# Patient Record
Sex: Male | Born: 1980 | Race: Black or African American | Hispanic: No | Marital: Married
Health system: Midwestern US, Community
[De-identification: ages and names within clinical notes are randomized; demographics above are authoritative.]

## PROBLEM LIST (undated history)

## (undated) DIAGNOSIS — B349 Viral infection, unspecified: Secondary | ICD-10-CM

## (undated) HISTORY — PX: COLONOSCOPY: SHX174

## (undated) HISTORY — PX: APPENDECTOMY: SHX54

---

## 2008-02-26 ENCOUNTER — Emergency Department (HOSPITAL_COMMUNITY): Admission: EM | Admit: 2008-02-26 | Discharge: 2008-02-26 | Payer: Self-pay | Admitting: Emergency Medicine

## 2008-05-25 ENCOUNTER — Emergency Department (HOSPITAL_COMMUNITY): Admission: EM | Admit: 2008-05-25 | Discharge: 2008-05-25 | Payer: Self-pay | Admitting: Emergency Medicine

## 2008-06-03 ENCOUNTER — Emergency Department (HOSPITAL_COMMUNITY): Admission: EM | Admit: 2008-06-03 | Discharge: 2008-06-03 | Payer: Self-pay | Admitting: Emergency Medicine

## 2008-06-21 ENCOUNTER — Emergency Department (HOSPITAL_COMMUNITY): Admission: EM | Admit: 2008-06-21 | Discharge: 2008-06-21 | Payer: Self-pay | Admitting: Emergency Medicine

## 2009-01-08 ENCOUNTER — Emergency Department (HOSPITAL_COMMUNITY): Admission: EM | Admit: 2009-01-08 | Discharge: 2009-01-08 | Payer: Self-pay | Admitting: Emergency Medicine

## 2009-01-15 ENCOUNTER — Emergency Department (HOSPITAL_COMMUNITY): Admission: EM | Admit: 2009-01-15 | Discharge: 2009-01-15 | Payer: Self-pay | Admitting: Emergency Medicine

## 2009-01-31 ENCOUNTER — Emergency Department (HOSPITAL_COMMUNITY): Admission: EM | Admit: 2009-01-31 | Discharge: 2009-01-31 | Payer: Self-pay | Admitting: Emergency Medicine

## 2009-04-10 IMAGING — CR DG ELBOW 2V*L*
2 series · 2 of 2 positions shown · non-contrast
Comparison: Humerus films of 06/03/2008

CLINICAL DATA: Laceration to posterior elbow/distal humerus 19 days
ago.  Status post suturing.  Nonhealing wound.  Question foreign
body.

LEFT ELBOW - 2 VIEW

[view not recorded (1 of 2)]
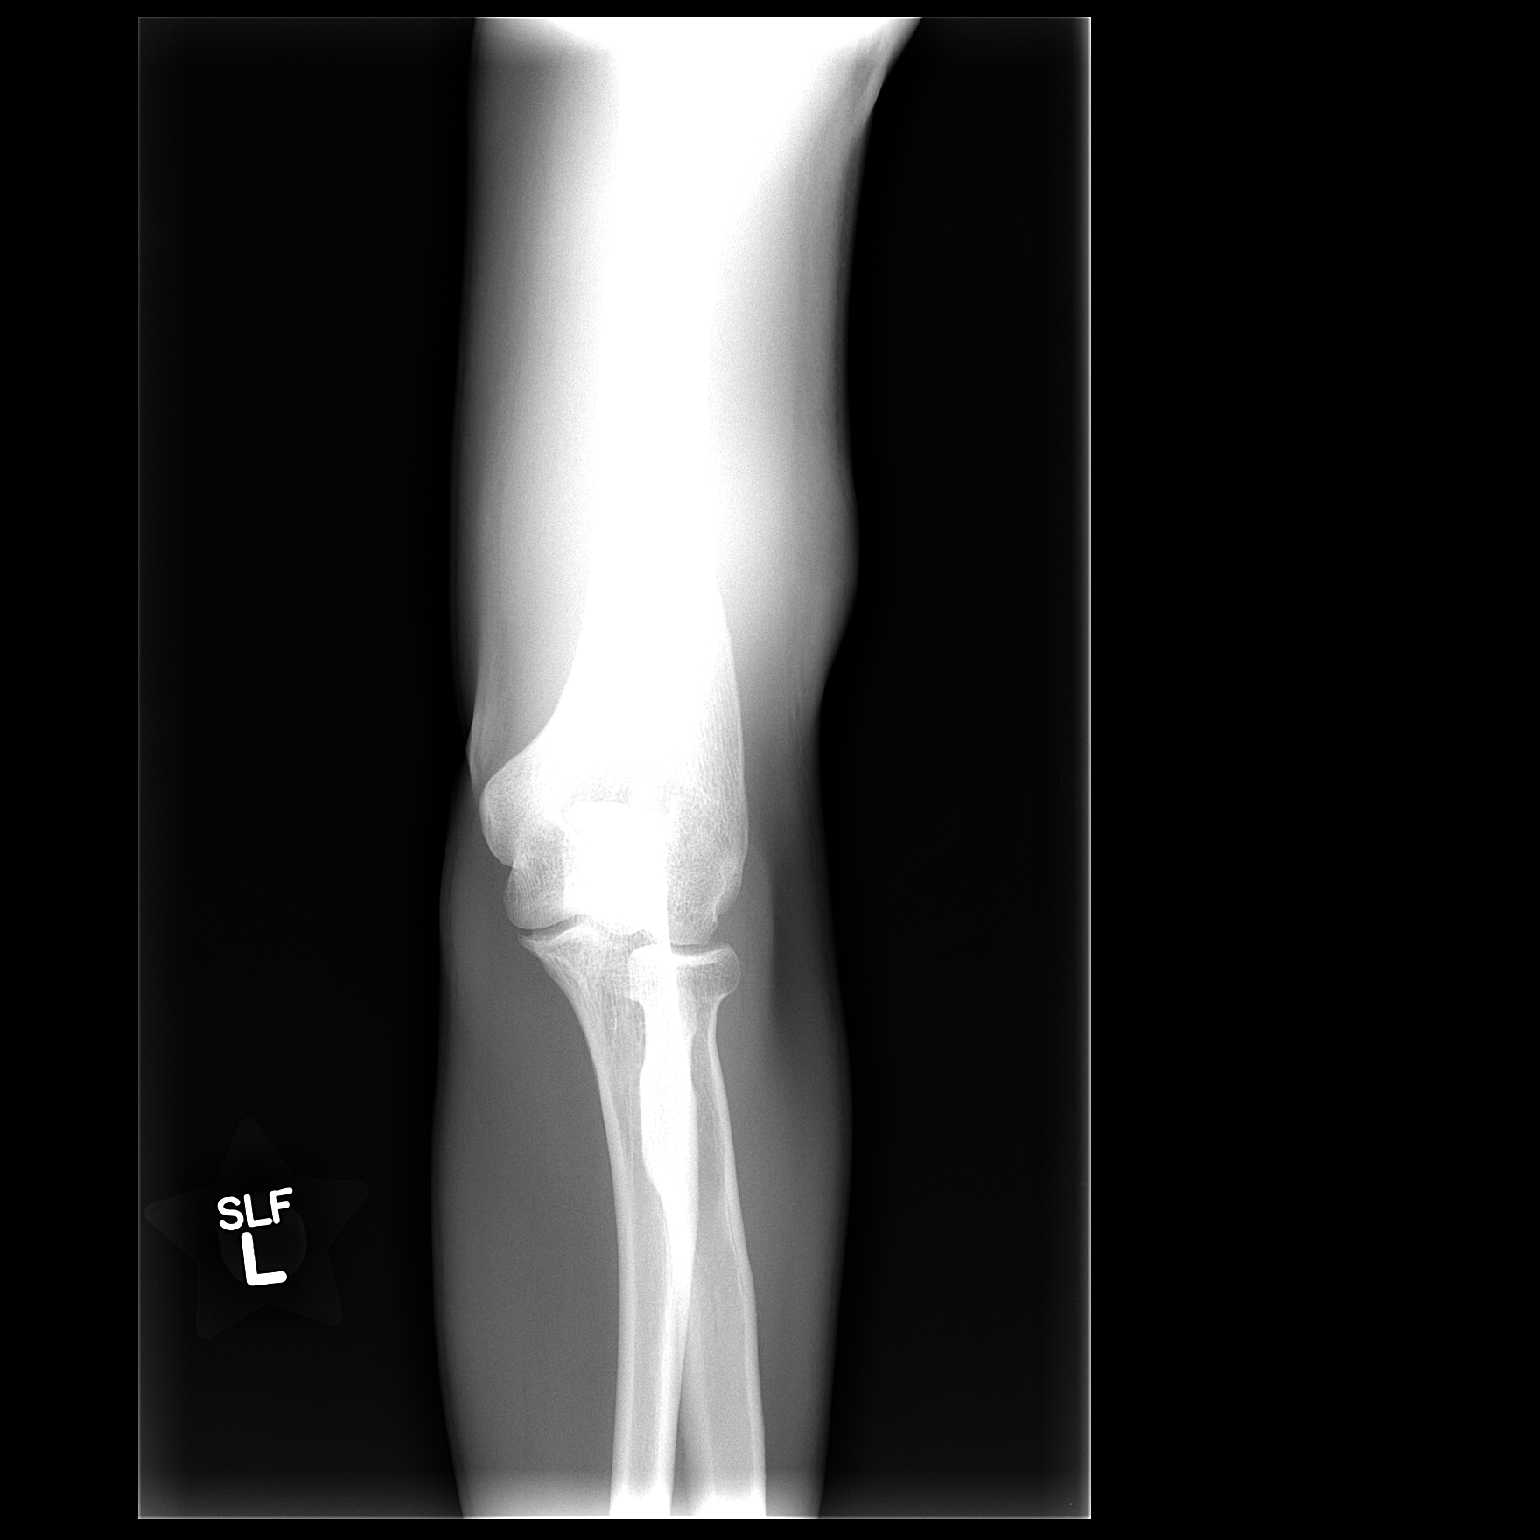

[view not recorded (2 of 2)]
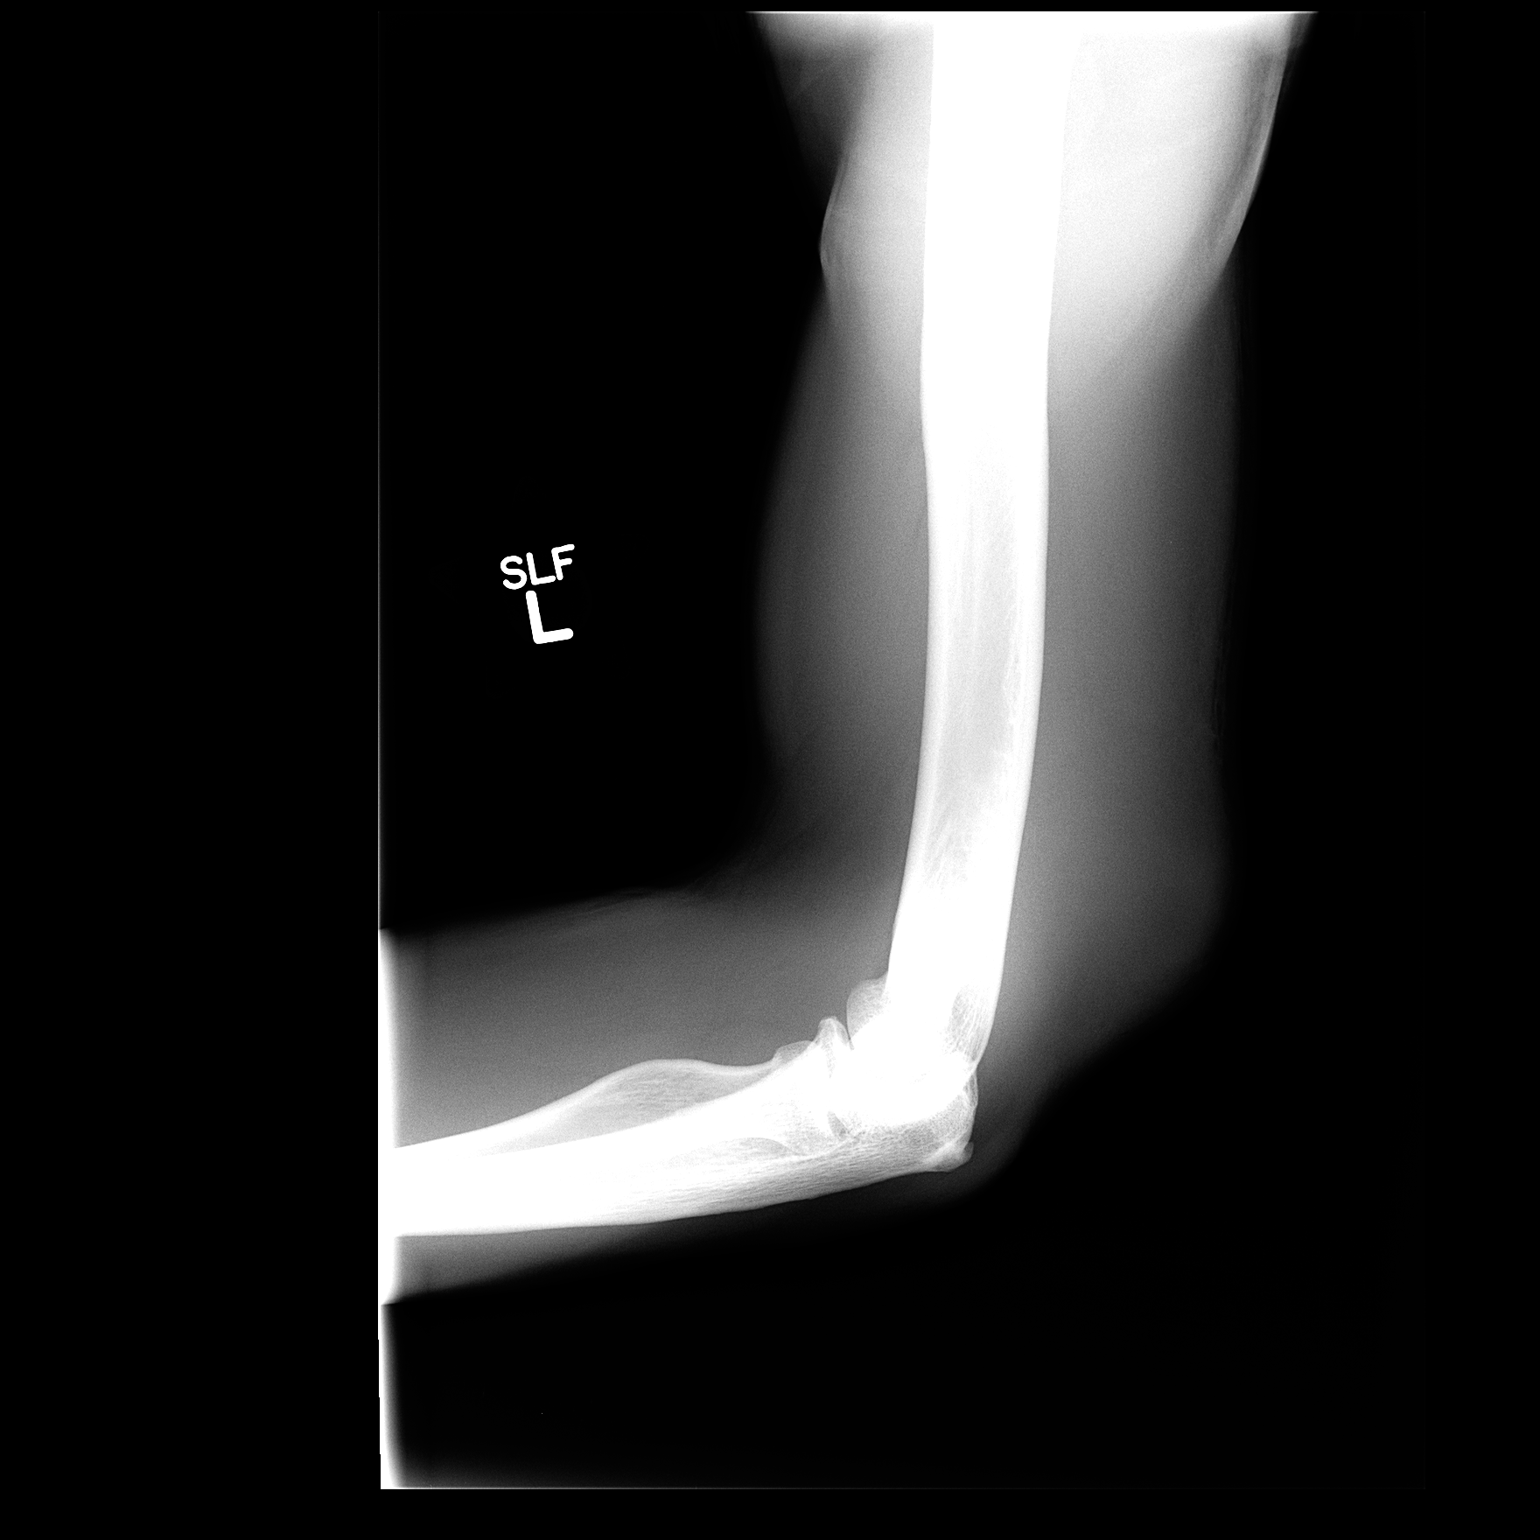

[2 of 2 positions shown; findings below may reference images not displayed]

FINDINGS: No acute fracture dislocation.  Soft tissue swelling
posterior and radial to the mid distal humeral shaft.  No evidence
of soft tissue gas or radiopaque foreign object.  No osseous
destruction.  Minimal enthesophyte at the triceps insertion.  No
elbow joint effusion.
IMPRESSION: Soft tissue swelling about the distal humerus without radiopaque
foreign object.

## 2009-08-10 ENCOUNTER — Emergency Department (HOSPITAL_COMMUNITY): Admission: EM | Admit: 2009-08-10 | Discharge: 2009-08-10 | Payer: Self-pay | Admitting: Emergency Medicine

## 2009-09-05 ENCOUNTER — Emergency Department (HOSPITAL_COMMUNITY): Admission: EM | Admit: 2009-09-05 | Discharge: 2009-09-05 | Payer: Self-pay | Admitting: Emergency Medicine

## 2010-06-25 IMAGING — CR DG HAND COMPLETE 3+V*R*
3 series · 3 of 3 positions shown · non-contrast
Comparison: None.

CLINICAL DATA: Infected finger.  Pain and swelling third finger.

RIGHT HAND - COMPLETE 3+ VIEW

[x hand pa right]
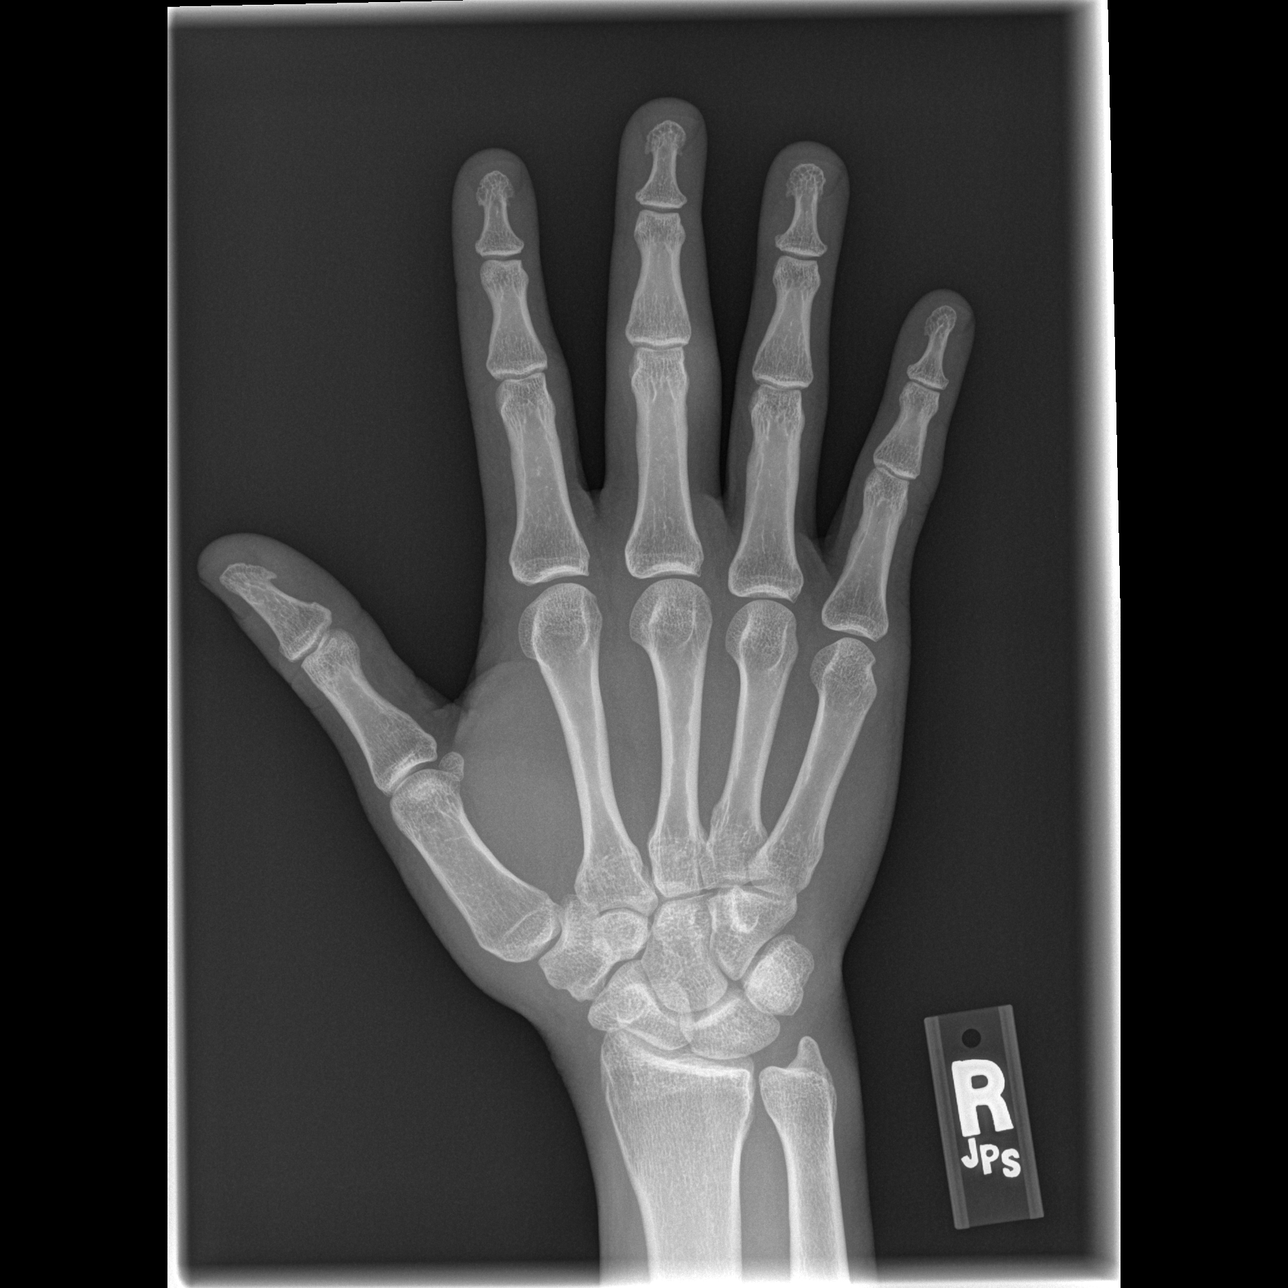

[x hand oblique right]
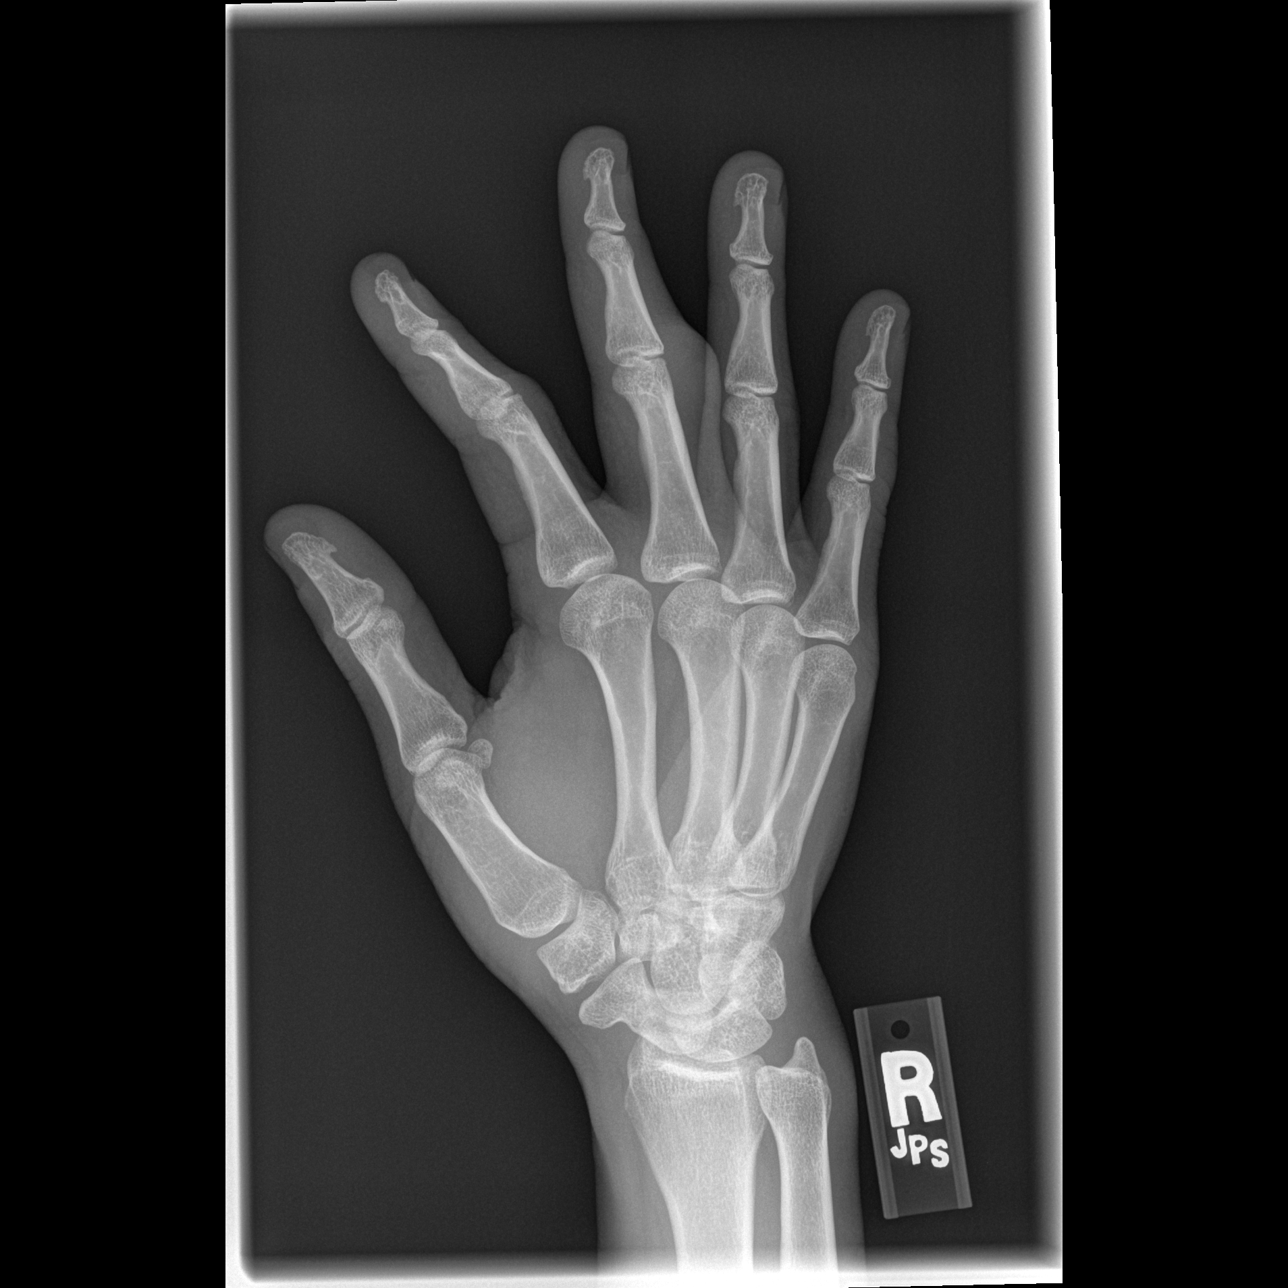

[x hand lat right]
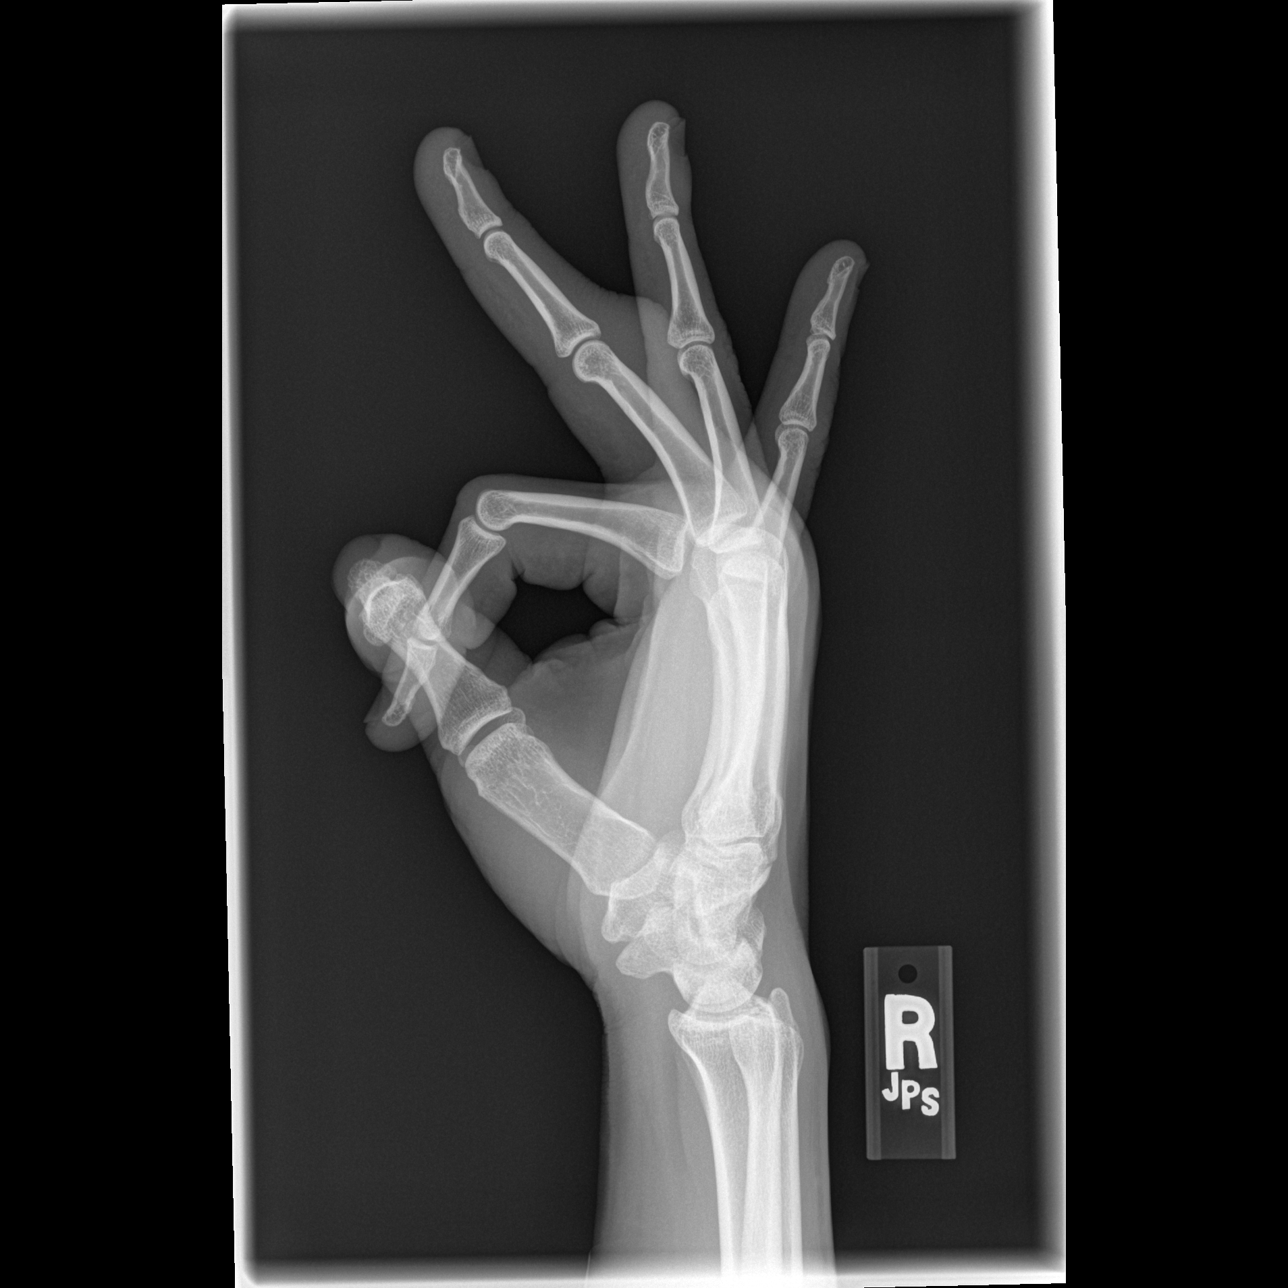

[3 of 3 positions shown; findings below may reference images not displayed]

FINDINGS: Soft tissue swelling adjacent to the PIP joint of the
third finger.  No radiopaque foreign body or acute bony
abnormality.
IMPRESSION: No acute osseous findings.

## 2010-07-23 LAB — BASIC METABOLIC PANEL
Calcium: 9 mg/dL (ref 8.4–10.5)
Chloride: 106 mEq/L (ref 96–112)
Creatinine, Ser: 0.74 mg/dL (ref 0.4–1.5)
GFR calc Af Amer: >60 mL/min (ref >60)
Glucose, Bld: 86 mg/dL (ref 70–99)

## 2010-07-23 LAB — CBC
HCT: 41.7 % (ref 39.0–52.0)
Hemoglobin: 14.1 g/dL (ref 13.0–17.0)
MCHC: 33.9 g/dL (ref 30.0–36.0)
MCV: 86.3 fL (ref 78.0–100.0)
Platelets: 205 10*3/uL (ref 150–400)
RBC: 4.83 MIL/uL (ref 4.22–5.81)
WBC: 4.2 10*3/uL (ref 4.0–10.5)

## 2010-07-23 LAB — SEDIMENTATION RATE: Sed Rate: 22 mm/hr — ABNORMAL HIGH (ref 0–16)

## 2010-07-23 LAB — DIFFERENTIAL
Lymphocytes Relative: 30 % (ref 12–46)
Monocytes Relative: 10 % (ref 3–12)
Neutrophils Relative %: 58 % (ref 43–77)

## 2010-07-24 LAB — URINALYSIS, ROUTINE W REFLEX MICROSCOPIC
Hgb urine dipstick: NEGATIVE
Ketones, ur: NEGATIVE mg/dL
Specific Gravity, Urine: 1.015 (ref 1.005–1.030)

## 2010-07-24 LAB — DIFFERENTIAL
Lymphs Abs: 0.9 10*3/uL (ref 0.7–4.0)
Monocytes Relative: 5 % (ref 3–12)
Neutro Abs: 5.2 10*3/uL (ref 1.7–7.7)
Neutrophils Relative %: 80 % — ABNORMAL HIGH (ref 43–77)

## 2010-07-24 LAB — COMPREHENSIVE METABOLIC PANEL
Albumin: 4.4 g/dL (ref 3.5–5.2)
Alkaline Phosphatase: 120 U/L — ABNORMAL HIGH (ref 39–117)
BUN: 12 mg/dL (ref 6–23)
CO2: 27 mEq/L (ref 19–32)
GFR calc Af Amer: >60 mL/min (ref >60)
GFR calc non Af Amer: >60 mL/min (ref >60)
Glucose, Bld: 97 mg/dL (ref 70–99)
Sodium: 137 mEq/L (ref 135–145)

## 2010-07-24 LAB — CBC
MCHC: 33.6 g/dL (ref 30.0–36.0)
MCV: 87 fL (ref 78.0–100.0)
Platelets: 202 10*3/uL (ref 150–400)
RBC: 5.57 MIL/uL (ref 4.22–5.81)
RDW: 15.1 % (ref 11.5–15.5)

## 2010-07-30 LAB — WOUND CULTURE

## 2013-10-10 ENCOUNTER — Inpatient Hospital Stay
Admit: 2013-10-10 | Discharge: 2013-10-13 | Disposition: A | Discharge status: Home / Self Care | Payer: MEDICAID | Attending: Psychiatry | Admitting: Psychiatry

## 2013-10-10 DIAGNOSIS — F312 Bipolar disorder, current episode manic severe with psychotic features: Secondary | ICD-10-CM

## 2013-10-10 LAB — CBC WITH AUTOMATED DIFF
ABS. BASOPHILS: 0 10*3/uL (ref 0.0–0.2)
ABS. EOSINOPHILS: 0.1 10*3/uL (ref 0.0–0.7)
ABS. LYMPHOCYTES: 2.5 10*3/uL (ref 1.2–3.4)
ABS. MONOCYTES: 0.2 10*3/uL — ABNORMAL LOW (ref 1.1–3.2)
ABS. NEUTROPHILS: 3.1 10*3/uL (ref 1.4–6.5)
BASOPHILS: 0 % (ref 0–2)
EOSINOPHILS: 1 % (ref 0–5)
HCT: 39.4 % (ref 36.8–45.2)
HGB: 14 g/dL (ref 12.8–15.0)
IMMATURE GRANULOCYTES: 0.2 % (ref 0.0–5.0)
LYMPHOCYTES: 42 % — ABNORMAL HIGH (ref 16–40)
MCH: 28.8 PG (ref 27–31)
MCHC: 35.5 g/dL (ref 32–36)
MCV: 81.1 FL (ref 81–99)
MONOCYTES: 4 % (ref 0–12)
MPV: 10.6 FL — ABNORMAL HIGH (ref 7.4–10.4)
NEUTROPHILS: 53 % (ref 40–70)
PLATELET: 195 10*3/uL (ref 140–450)
RBC: 4.86 M/uL (ref 4.0–5.2)
RDW: 13.5 % (ref 11.5–14.5)
WBC: 5.9 10*3/uL (ref 4.8–10.8)

## 2013-10-10 LAB — METABOLIC PANEL, COMPREHENSIVE
A-G Ratio: 1.2 (ref 1.0–3.1)
ALT (SGPT): 20 U/L (ref 12.0–78.0)
AST (SGOT): 13 U/L — ABNORMAL LOW (ref 15–37)
Albumin: 4.2 g/dL (ref 3.40–5.00)
Alk. phosphatase: 67 U/L (ref 46–116)
Anion gap: 16 mmol/L (ref 10–17)
BUN/Creatinine ratio: 15 (ref 6.0–20.0)
BUN: 17 MG/DL (ref 7–18)
Bilirubin, total: 0.4 MG/DL (ref 0.20–1.00)
CO2: 26 mmol/L (ref 21–32)
Calcium: 8.9 MG/DL (ref 8.5–10.1)
Chloride: 104 mmol/L (ref 98–107)
Creatinine: 1.1 MG/DL (ref 0.6–1.3)
GFR est AA: >60 mL/min/{1.73_m2} (ref >60)
GFR est non-AA: >60 mL/min/{1.73_m2} (ref >60)
Globulin: 3.6 g/dL
Glucose: 102 mg/dL (ref 74–106)
Potassium: 3.6 mmol/L (ref 3.50–5.10)
Protein, total: 7.8 g/dL (ref 6.40–8.20)
Sodium: 142 mmol/L (ref 136–145)

## 2013-10-10 LAB — URINALYSIS W/ RFLX MICROSCOPIC
Bilirubin: NEGATIVE
Blood: NEGATIVE
Glucose: NEGATIVE mg/dL
Ketone: NEGATIVE mg/dL
Leukocyte Esterase: NEGATIVE
Nitrites: NEGATIVE
Protein: NEGATIVE mg/dL
Specific gravity: 1.009
Urobilinogen: 0.2 EU/dL (ref 0.2–1.0)
pH (UA): 5 (ref 5.0–7.0)

## 2013-10-10 LAB — ETHYL ALCOHOL: ALCOHOL(ETHYL),SERUM: 56 MG/DL — ABNORMAL HIGH (ref <3.0)

## 2013-10-10 LAB — DRUG SCREEN, URINE
AMPHETAMINES: NEGATIVE
BARBITURATES: NEGATIVE
BENZODIAZEPINES: NEGATIVE
COCAINE: NEGATIVE
OPIATES: NEGATIVE
PCP(PHENCYCLIDINE): NEGATIVE
THC (TH-CANNABINOL): POSITIVE — AB
TRICYCLICS: NEGATIVE

## 2013-10-10 LAB — VALPROIC ACID: Valproic acid: 0 ug/ml — ABNORMAL LOW (ref 50–100)

## 2013-10-10 MED ADMIN — therapeutic multivitamin (THERAGRAN) tablet 1 Tab: ORAL | @ 15:00:00

## 2013-10-10 MED ADMIN — divalproex DR (DEPAKOTE) tablet 500 mg: ORAL | @ 20:00:00 | NDC 00378104501

## 2013-10-10 MED ADMIN — folic acid (FOLVITE) tablet 1 mg: ORAL | @ 15:00:00 | NDC 62584089711

## 2013-10-10 MED ADMIN — QUEtiapine (SEROquel) tablet 100 mg: ORAL | @ 20:00:00 | NDC 68084053211

## 2013-10-10 MED ADMIN — divalproex DR (DEPAKOTE) tablet 500 mg: ORAL | @ 15:00:00 | NDC 00378104501

## 2013-10-10 MED ADMIN — hydrOXYzine (VISTARIL) capsule 50 mg: ORAL | @ 15:00:00 | NDC 62584074111

## 2013-10-10 MED ADMIN — Thiamine Mononitrate (B-1) tablet 100 mg: ORAL | @ 15:00:00 | NDC 90011015050

## 2013-10-10 MED FILL — SEROQUEL 100 MG TABLET: 100 mg | ORAL | Qty: 1

## 2013-10-10 MED FILL — FOLIC ACID 1 MG TAB: 1 mg | ORAL | Qty: 1

## 2013-10-10 MED FILL — DIVALPROEX 500 MG TAB, DELAYED RELEASE: 500 mg | ORAL | Qty: 1

## 2013-10-10 MED FILL — HYDROXYZINE PAMOATE 50 MG CAP: 50 mg | ORAL | Qty: 1

## 2013-10-10 MED FILL — THERA 400 MCG TABLET: 400 mcg | ORAL | Qty: 1

## 2013-10-10 MED FILL — THIAMINE MONONITRATE 100 MG TABLET: 100 mg | ORAL | Qty: 1

## 2013-10-10 NOTE — ED Notes (Signed)
Psych SW eval done and after contact w/ psychiatrist, pt to be admitted for further eval and tx;admissiion order obtained and pt awaits bed assignment;remains in safe environment w/ sitter at bedside;VSS and pt remains calm and co-operative.

## 2013-10-10 NOTE — Other (Signed)
TRANSFER - OUT REPORT:    Verbal report given to RN Tim(name) on Kevin Coffey  [facility] for routine progression of care       Report consisted of patient???s Situation, Background, Assessment and   Recommendations(SBAR).     Information from the following report(s) SBAR, ED Summary and Recent Results was reviewed with the receiving nurse.    Lines:       Opportunity for questions and clarification was provided.      Patient transported with:  Research officer, political partyscort and security

## 2013-10-10 NOTE — Progress Notes (Signed)
Pt is a 33 year old ,married ,AA, male who called 911 and was bought to the ER by ambo. Pt reported at triage that he was feeling SI with no plan.  Marland Kitchen. Pt presented alert and oriented times four.  Pt report that he was seeking help because he was feeling out of control since he threw away his psych medications Depakote, Seroquel and Xanax after his visit with his Psychiatrist, Dr Selena BattenKim at the beginning of this month.Pt [facility] on [address].  Pt stated that he felt that he did not need the medication because he was feeling "well".  A couple of days ago ,the Pt stated that he visited the ER at [facility] but left before he was seen.  Pt reports that his wife told him to get check out because he is not sleeping well,having racing thoughts ,talking loud and yelling at everybody at home and being aggressive toward everyone  Pt reports that his next appointment with Dr Selena BattenKim is on 10/12/13, and he feels he can not wait ,he needs help now .  The Pt has poor insight and judgement about his illness. Pt reports that [place].  Pt reports that at that time on his admission to the psych ward, he was manic.  Pt is now reporting that he is not SI, but wasn't sure about HI.  Pt denies hallucinations. This Pt receives SSI for his mental illness . The Pt's tox screen is positive for marijuana . Pt state,s he does not use marijuana.  Pt's alcohol at triage was 56 . Pt reports he had a small drink on 10/09/13.  Pt is reporting no medical problems.  Other information about the Pt: Pt lives with his wife and two of his children .another  child is in college.  /Pt was [place] and around the age of [age] he [facility] until [facility] out.  /Pt reports that  he is the youngest of three children/Pt has some college education/ Pt is on probation for the unauthorization of a car.and Pt named his supports as his wife ,his Aunt  and Sister-in Law/ Pt signed the voluntary form for admission.    Axis 1 Unspecified Bipolar and Related Disorder/Alcohol Use Disorder-mild/Cannabais-mild  Axis 11 Deferred  Axis 111 Deferred   Axis IV Non-compliance with psych medications    This Pt was discussed with Dr. Genella RifeGuerrero at 3:16 am and the decision is to admit the Pt [facility]       Jennifer B at 3:28 am approved three days for this Pt.  The authorization number is [id number]

## 2013-10-10 NOTE — ED Notes (Signed)
Pt presented to ed via ambo from street w// c/o SI w/ no def plan;pt st stopped taking his home meds because he felt he could handle it himself;denise HI and/or A/V hallucinations but feels depressed

## 2013-10-10 NOTE — Progress Notes (Signed)
10/10/13 2021   Attitude and Behavior   General Attitude Attentive   Affect Labile   Mood Depressed   Insight Fair   Judgement Impaired   Memory  Intact   Thought Content Unremarkable   Hallucinations None   Delusions None   Concentration Fair   Speech Pattern Unremarkable   Thought Process Circumstantial   Motor Activity Unremarkable   pt is alert and oriented to person, place and time. Pt is withdrawn to self and isolative to room. Pt verbally denies pain, si/hi, denies other psych symptoms. When asked why pt came to the hospital, pt states " i needed to get my meds back into my system". Pt is meds complaint,  calm and cooperative. Pt had a quiet night, will continue to provide for safety, Q6515mins checks maintained.

## 2013-10-10 NOTE — ED Provider Notes (Signed)
HPI Comments: Kevin Coffey is a 33 y.o. male with a hx of bipolar disorder, PTSD, and acute psychosis who presents to the ED via EMS with complaint of SI and depression. Pt has not been taking his psych medications since June 10; he still sees his psychiatrist, and he did not tell his psychiatrist about his noncompliance with his medication. Pt states "I've been feeling depressed for a while". Pt denies any other medical complaints.       Patient is a [age] y.o. male presenting with mental health disorder. The history is provided by the patient.   Mental Health Problem   This is a recurrent problem. The current episode started more than 1 week ago. The problem has been gradually worsening. Pertinent negatives include no seizures, no weakness, no delusions and no hallucinations. Risk factors include the patient not taking meds correctly. His past medical history is significant for depression and psychotropic medication treatment. His past medical history does not include diabetes or COPD.        Past Medical History   Diagnosis Date   ??? Psychiatric disorder      Depression        History reviewed. No pertinent past surgical history.      History reviewed. No pertinent family history.     History     Social History   ??? Marital Status: MARRIED     Spouse Name: N/A     Number of Children: N/A   ??? Years of Education: N/A     Occupational History   ??? Not on file.     Social History Main Topics   ??? Smoking status: Current Every Day Smoker -- 0.50 packs/day   ??? Smokeless tobacco: Not on file   ??? Alcohol Use: Yes   ??? Drug Use: No   ??? Sexual Activity: Not on file     Other Topics Concern   ??? Not on file     Social History Narrative   ??? No narrative on file                  ALLERGIES: Review of patient's allergies indicates no known allergies.      Review of Systems   Constitutional: Negative.  Negative for fever and chills.   HENT: Negative.  Negative for congestion and sore throat.     Respiratory: Negative.  Negative for cough and shortness of breath.    Cardiovascular: Negative.  Negative for chest pain and palpitations.   Gastrointestinal: Negative.  Negative for nausea, vomiting, abdominal pain and diarrhea.   Genitourinary: Negative.  Negative for dysuria and frequency.   Musculoskeletal: Negative.  Negative for myalgias and arthralgias.   Skin: Negative.  Negative for pallor and rash.   Allergic/Immunologic: Negative.  Negative for immunocompromised state.   Neurological: Negative.  Negative for seizures, weakness, light-headedness and headaches.   Psychiatric/Behavioral: Positive for suicidal ideas. Negative for hallucinations. The patient is not nervous/anxious.    All other systems reviewed and are negative.      Filed Vitals:    10/10/13 0020   BP: 127/74   Pulse: 70   Temp: 97.1 ??F (36.2 ??C)   Resp: 17   Height: 5\' 8"  (1.727 Kevin Coffey)   Weight: 67 kg (147 lb 11.3 oz)   SpO2: 99%            Physical Exam   Constitutional: He is oriented to person, place, and time. He appears well-developed and well-nourished.   HENT:  Head: Normocephalic and atraumatic.   Eyes: Conjunctivae and EOM are normal. Pupils are equal, round, and reactive to light. Right eye exhibits no discharge. Left eye exhibits no discharge. No scleral icterus.   Neck: Normal range of motion. Neck supple.   Cardiovascular: Normal rate, regular rhythm and normal heart sounds.  Exam reveals no gallop and no friction rub.    No murmur heard.  Pulmonary/Chest: Effort normal and breath sounds normal. No respiratory distress. He has no wheezes. He has no rales.   Abdominal: Soft. Bowel sounds are normal. He exhibits no distension. There is no tenderness. There is no rebound and no guarding.   Musculoskeletal: Normal range of motion. He exhibits no edema.   Neurological: He is alert and oriented to person, place, and time.   Skin: Skin is warm and dry. No rash noted. No erythema.    Psychiatric: He has a normal mood and affect. His behavior is normal. Judgment normal. He expresses suicidal ideation. He expresses no suicidal plans.   Nursing note and vitals reviewed.       MDM  Number of Diagnoses or Management Options  Depression: established and improving  Diagnosis management comments: 33 y.o. male with depression and SI  Blood pressure 122/86, pulse 78, temperature 98.2 ??F (36.8 ??C), resp. rate 18, height 5\' 8"  (1.727 Kevin Coffey), weight 67 kg (147 lb 11.3 oz), SpO2 98 %.    Plan:     Labs: CBC, CMP, UA, Drug screen, EtOH  Psych/social evaluation   -----  1:03 AM  Documented by Shona NeedlesKatelyn N Seale, acting as a scribe for Dr. Juliann MuleM Christine Jonatha Gagen, MD      PROVIDER ATTESTATION:  11:15 PM    The entirety of this note, signed by me, accurately reflects all works, treatments, procedures, and medical decision making performed by me, Juliann MuleM Christine Phu Record, MD.        Patient Progress  Patient progress: stable    Diagnostic Studies:    Lab Data:  Labs Reviewed   METABOLIC PANEL, COMPREHENSIVE - Abnormal; Notable for the following:     AST 13 (*)     All other components within normal limits   CBC WITH AUTOMATED DIFF - Abnormal; Notable for the following:     MPV 10.6 (*)     LYMPHOCYTES 42 (*)     ABS. MONOCYTES 0.2 (*)     All other components within normal limits   DRUG SCREEN, URINE - Abnormal; Notable for the following:     THC (TH-CANNABINOL) POSITIVE (*)     All other components within normal limits   ETHYL ALCOHOL - Abnormal; Notable for the following:     ALCOHOL(ETHYL),SERUM 56 (*)     All other components within normal limits   VALPROIC ACID - Abnormal; Notable for the following:     Valproic acid 0 (*)     All other components within normal limits   URINALYSIS W/ RFLX MICROSCOPIC         ED Course:       Procedures

## 2013-10-10 NOTE — ED Notes (Signed)
Pt resting quietly w/ eyes closed;remains in safe environment w/ sitter at bedside;awaits psych SW eval

## 2013-10-10 NOTE — Other (Signed)
Pt had a episode when being assessed by Dr.Leon-Guerrero which he became upset and slid chair in room.Pt states he does not want this psychiatrist to see him or take care of him.Pt easily redirected and calm and cooperative at this time.Pt apolagyzed to staff but still is not @ this time receptive to having Dr.Guerrero.Will have this assessed by doctors tomorrow.

## 2013-10-10 NOTE — Progress Notes (Signed)
NUTRITION note       Nutrition screening referral was triggered based on results obtained during nursing admission assessment.  The patient's chart was reviewed and nutrition assessment is not indicated at this time.  Plan to see patient for rescreen as indicated.  Thanks.       Crawford GivensLeah W Gecheo RD LDN P. [phone]

## 2013-10-10 NOTE — Progress Notes (Signed)
TRANSFER - IN REPORT:    [facility] on Kevin Coffey  being received from ED(unit) for routine progression of care      Report consisted of patient???s Situation, Background, Assessment and   Recommendations(SBAR).     Information from the following report(s) SBAR, Kardex and ED Summary was reviewed with the receiving nurse.      Opportunity for questions and clarification was provided.      Assessment completed upon patient???s arrival to unit and care assumed.

## 2013-10-10 NOTE — Progress Notes (Signed)
Pt admitted from [facility] to [facility] voluntary [facility].Pt diagnosis is Bipolar.Pt states he drank a miniature of vodka PTA.Pt states he does not smoke marijuana but is around it a lot.Pt states he has been off his meds for about a month.Pt's mood and affect labile.Pt states his wife is a Wellsite geologistmental health worker which she is a great support to him.Pt has NKDA.Pt me compliant and agrees to taking medication and hopes to get stable on meds and get a good therapist.Q15 minute checks initiated.Pt cooperative with assessment.Pt oriented to unit and informed of unacceptable behavior.

## 2013-10-10 NOTE — Behavioral Health Treatment Team (Signed)
Pt is a [age] years old AAm who called the police and [facility]. Pt arrived the unit the AM accompanied by a staff and a security detail. He was A O X 3. Pt has a history of mental disorder, he [facility] and was on Depakote, Seroquel and Xanax, he stopped taking his medications because he felt better and decided that he no longer needs them  Pt is passed on to day shift to complete the admission assessment.

## 2013-10-10 NOTE — Progress Notes (Signed)
Problem: Suicide/Homicide (Adult/Pediatric)  Goal: *STG: Remains safe in hospital  Outcome: Progressing Towards Goal  Nurse rounds and safety checks maintained.  Goal: *STG: Seeks staff when feelings of self harm or harm towards others arise  Outcome: Progressing Towards Goal  Patient agrees to inform staff if having thoughts to harm self or others.  Goal: *STG/LTG: No longer expresses self destructive or suicidal/homicidal thoughts  Outcome: Progressing Towards Goal  Patient deny current thoughts to harm self or others.  Goal: Interventions  Outcome: Progressing Towards Goal  Patient encouraged to verbalize feelings.    Problem: Patient Education: Go to Patient Education Activity  Goal: Patient/Family Education  Outcome: Progressing Towards Goal  Illness teaching provided.    Problem: Falls - Risk of  Goal: *Absence of falls  Outcome: Progressing Towards Goal  Patient with no falls.  Goal: *Knowledge of fall prevention  Outcome: Progressing Towards Goal  Patient with non skid footwear when ambulating.

## 2013-10-10 NOTE — H&P (Signed)
Chart reviewed, will see patient and note to follow.

## 2013-10-10 NOTE — H&P (Signed)
[facility]    Admission Note      Patient:  Kevin Coffey Age:  33 y.o. DOB:  07/02/1980     SEX:  male MRN:  [id number] CSN:  [id number]    10/10/2013  1:22 PM    Informants and Patient Contacts:     EP:    Voluntary:     Identifying Data and Chief Compliant:    History of Present Illness: Copied from ER SW note: Pt is a 33 year old ,married ,AA, male who called 911 and was bought to the ER by ambo. Pt reported at triage that he was feeling SI with no plan. Marland Kitchen Pt presented alert and oriented times four. Pt report that he was seeking help because he was feeling out of control since he threw away his psych medications Depakote, Seroquel and Xanax after his visit with his Psychiatrist, Dr Maudie Mercury at the beginning of this month.Pt sees Dr Maudie Mercury at the [facility] on [address]. Pt stated that he felt that he did not need the medication because he was feeling "well". A couple of days ago ,the Pt stated that he visited the ER at [facility] but left before he was seen. Pt reports that his wife told him to get check out because he is not sleeping well,having racing thoughts ,talking loud and yelling at everybody at home and being aggressive toward everyone Pt reports that his next appointment with Dr Maudie Mercury is on 10/12/13, and he feels he can not wait ,he needs help now . The Pt has poor insight and judgement about his illness. Pt reports that his last in-pt for psych was three years ago in [place]. Pt reports that at that time on his admission to the psych ward, he was manic. Pt is now reporting that he is not SI, but wasn't sure about HI. Pt denies hallucinations. This Pt receives SSI for his mental illness . The Pt's tox screen is positive for marijuana . Pt state,s he does not use marijuana. Pt's alcohol at triage was 56 . Pt reports he had a small drink on 10/09/13. Pt is reporting no medical problems. Other  information about the Pt: Pt lives with his wife and two of his children .another child is in college. /Pt was born in [place] and around the age of [age] he was placed in [facility] until he aged out. /Pt reports that he is the youngest of three children/Pt has some college education/ Pt is on probation for the unauthorization of a car.and Pt named his supports as his wife ,his Aunt and Sister-in Law/ Pt signed the voluntary form for admission.    My addendum: Patient called 911 as he was feeling that he needed to be in the hospital to get back on his medicine. States he gets argumentative, "I don't care", depressed, isolative, thinks people out to get him. States this is not happening currently, but his behavior is affecting his marriage. His demeanor was irritable, possibly suspicious in that he insisted on closing the door when he saw me in my office (despite my stated preference to keep it open due to ventilation), loud voice that escalated when I stated that I would start him back on all his meds except Xanax, inability to participate in discussion about this due to overtalking me and not listening. When I tried to escort him out of the office stating that I was not  going to discuss this anymore, he flipped over a chair and cursed and had to be escorted out by staff. No prn meds had to be given as he calmed down later by going to his room and with a show of force by security.    Past Psychiatric/Medical History:   Past Medical History   Diagnosis Date   ??? Psychiatric disorder      Depression       Substance Use History: denies, but may be minimizing, cannabis in urine    Allergies: No Known Allergies    Prior to admission medications: Prescriptions prior to admission   Medication Sig   ??? divalproex DR (DEPAKOTE) 500 mg tablet Take 500 mg by mouth daily.   ??? QUEtiapine (SEROQUEL) 100 mg tablet Take 100 mg by mouth daily.   ??? ALPRAZolam (XANAX) 0.25 mg tablet Take 1 mg by mouth two (2) times a  day. Indications: ANXIETY   Depakote 500 mg bid, Seroquel 100 mg bid, Xanax 2 mg bid.     Current medications:   Current Facility-Administered Medications   Medication Dose Route Frequency   ??? haloperidol (HALDOL) tablet 5 mg  5 mg Oral Q4H PRN   ??? diphenhydrAMINE (BENADRYL) capsule 50 mg  50 mg Oral Q4H PRN   ??? haloperidol lactate (HALDOL) injection 5 mg  5 mg IntraMUSCular Q4H PRN   ??? diphenhydrAMINE (BENADRYL) injection 50 mg  50 mg IntraMUSCular Q4H PRN   ??? hydrOXYzine (VISTARIL) capsule 50 mg  50 mg Oral QHS PRN   ??? hydrOXYzine (VISTARIL) capsule 50 mg  50 mg Oral Q6H PRN   ??? acetaminophen (TYLENOL) tablet 650 mg  650 mg Oral Q4H PRN   ??? nicotine (NICORETTE) gum 2 mg  2 mg Oral PRN   ??? chlordiazePOXIDE (LIBRIUM) capsule 25 mg  25 mg Oral Q4H PRN   ??? therapeutic multivitamin (THERAGRAN) tablet 1 Tab  1 Tab Oral DAILY   ??? Thiamine Mononitrate (B-1) tablet 100 mg  100 mg Oral DAILY   ??? folic acid (FOLVITE) tablet 1 mg  1 mg Oral DAILY   ??? divalproex DR (DEPAKOTE) tablet 500 mg  500 mg Oral QHS       Outpatient Physicians:    Medical:    Psychiatric: [facility], Dr. Maudie Mercury   Other:     Review of Systems: denies but may be minimizing    Family History of psychiatric and medical illness and substance abuse  History reviewed. No pertinent family history.    Social History: Married,     Mental Status Exam    Appearance   General Behavior Young AA man, loud/labile, flipped a chair/cursed/argumentative  when I stated I was not going to prescribe Xanax   Speech form and content,  Language  Associations  Form of Thought Loud, rapid, somewhat tangential   Mood, Affect  Self-Attitude  Vital Sense  SI/HI/PDW Expansive, labile, no stated SI/HI/violent ideation but may be minimizing, behavior is aggressive   Abnormal Perceptions and illusions Hallucinations: none noted   Delusions Possible suspiciousness   Anxiety moderate   COGNITION Intelligence Abstraction Grossly intact   Judgement Insight Poor/poor        Data/Labs/Vital Signs    Patient Vitals for the past 48 hrs:   BP Temp Pulse Resp SpO2 Height Weight   10/10/13 0500 106/64 mmHg 97.9 ??F (36.6 ??C) 62 18 98 % - -   10/10/13 0416 119/68 mmHg 97.9 ??F (36.6 ??C) 68 16 98 % - -  10/10/13 0020 127/74 mmHg 97.1 ??F (36.2 ??C) 70 17 99 % 5' 8"  (1.727 m) 67 kg (147 lb 11.3 oz)       Recent Results (from the past 48 hour(s))   METABOLIC PANEL, COMPREHENSIVE    Collection Time: 10/10/13 12:30 AM   Result Value Ref Range    Sodium 142 136 - 145 mmol/L    Potassium 3.6 3.50 - 5.10 mmol/L    Chloride 104 98 - 107 mmol/L    CO2 26 21 - 32 mmol/L    Anion gap 16 10 - 17 mmol/L    Glucose 102 74 - 106 mg/dL    BUN 17 7 - 18 MG/DL    Creatinine 1.10 0.6 - 1.3 MG/DL    BUN/Creatinine ratio 15 6.0 - 20.0      GFR est AA >60 >60 ml/min/1.72m    GFR est non-AA >60 >60 ml/min/1.728m   Calcium 8.9 8.5 - 10.1 MG/DL    Bilirubin, total 0.4 0.20 - 1.00 MG/DL    ALT 20 12.0 - 78.0 U/L    AST 13 (L) 15 - 37 U/L    Alk. phosphatase 67 46 - 116 U/L    Protein, total 7.8 6.40 - 8.20 g/dL    Albumin 4.2 3.40 - 5.00 g/dL    Globulin 3.6 g/dL    A-G Ratio 1.2 1.0 - 3.1     CBC WITH AUTOMATED DIFF    Collection Time: 10/10/13 12:30 AM   Result Value Ref Range    WBC 5.9 4.8 - 10.8 K/uL    RBC 4.86 4.0 - 5.2 M/uL    HGB 14.0 12.8 - 15.0 g/dL    HCT 39.4 36.8 - 45.2 %    MCV 81.1 81 - 99 FL    MCH 28.8 27 - 31 PG    MCHC 35.5 32 - 36 g/dL    RDW 13.5 11.5 - 14.5 %    PLATELET 195 140 - 450 K/uL    MPV 10.6 (H) 7.4 - 10.4 FL    NEUTROPHILS 53 40 - 70 %    LYMPHOCYTES 42 (H) 16 - 40 %    MONOCYTES 4 0 - 12 %    EOSINOPHILS 1 0 - 5 %    BASOPHILS 0 0 - 2 %    ABS. NEUTROPHILS 3.1 1.4 - 6.5 K/UL    ABS. LYMPHOCYTES 2.5 1.2 - 3.4 K/UL    ABS. MONOCYTES 0.2 (L) 1.1 - 3.2 K/UL    ABS. EOSINOPHILS 0.1 0.0 - 0.7 K/UL    ABS. BASOPHILS 0.0 0.0 - 0.2 K/UL    DF AUTOMATED      IMMATURE GRANULOCYTES 0.2 0.0 - 5.0 %   URINALYSIS W/ RFLX MICROSCOPIC    Collection Time: 10/10/13 12:30 AM   Result Value Ref Range     Color YELLOW      Appearance CLEAR      Specific gravity 1.009      pH (UA) 5.0 5.0 - 7.0      Protein NEGATIVE  NEG mg/dL    Glucose NEGATIVE  NEG mg/dL    Ketone NEGATIVE  NEG mg/dL    Bilirubin NEGATIVE  NEG      Blood NEGATIVE  NEG      Urobilinogen 0.2 0.2 - 1.0 EU/dL    Nitrites NEGATIVE  NEG      Leukocyte Esterase NEGATIVE  NEG     DRUG SCREEN, URINE    Collection Time: 10/10/13  12:30 AM   Result Value Ref Range    AMPHETAMINE NEGATIVE       BARBITURATES NEGATIVE  NEG      BENZODIAZEPINE NEGATIVE       COCAINE NEGATIVE  NEG      OPIATES NEGATIVE  NEG      PCP(PHENCYCLIDINE) NEGATIVE  NEG      THC (TH-CANNABINOL) POSITIVE (A) NEG      TRICYCLICS NEGATIVE  NEG      HDSCOM (NOTE)    ETHYL ALCOHOL    Collection Time: 10/10/13 12:30 AM   Result Value Ref Range    ALCOHOL(ETHYL),SERUM 56 (H) <3.0 MG/DL   VALPROIC ACID    Collection Time: 10/10/13 12:30 AM   Result Value Ref Range    Valproic acid 0 (L) 50 - 100 ug/ml     ASSESSMENT/PLAN: Young man with history of bipolar, off meds for about a month and self-presented to get back on meds which he states are Depakote, Seroquel, Xanax. Irritable/aggressive here.    Axis I: Bipolar Disorder, manic with psychosis, Cannabis use (R/O disorder)  Axis II: deferred  Axis III:   Past Medical History   Diagnosis Date   ??? Psychiatric disorder      Depression     Axis IV:  social  Axis V: 34      Recommendations/Plan  1. Admit for safety.  2. Level of Observation: q 15 minutes, no roommate due to lability/aggression.  3. Restart Depakote, Seroquel but not Xanax. Not at risk for benzo withdrawal as has been off this for several weeks (if in fact he is truly prescribed this).      Pt has been involved in tx planning, been offered the opportunity to ask questions, is aware of risk/benefit profile and effects/side effects of medications, and agrees with treatment plan.           I certify that this patient's inpatient psychiatric hospital services  furnished since the previous certification were, and continue to be, required for treatment that could reasonably be expected to improve the patient's condition, or for diagnostic study, and that the patient continues to need, on a daily basis, active treatment furnished directly by or requiring the supervision of inpatient psychiatric facility personnel.  In addition the hospital records show that services furnished were intensive treatment services, admission or related services, or equivalent services.    Lance Morin, MD 10/10/2013 1:22 PM

## 2013-10-10 NOTE — Other (Signed)
Bedside shift change report given  by TARA C BUCHANAN, RN   (offgoing nurse).  Report given with SBAR.

## 2013-10-11 LAB — TSH 3RD GENERATION: TSH: 0.79 u[IU]/mL (ref 0.55–7.78)

## 2013-10-11 MED ADMIN — QUEtiapine (SEROquel) tablet 100 mg: ORAL | @ 13:00:00 | NDC 68084053211

## 2013-10-11 MED ADMIN — divalproex DR (DEPAKOTE) tablet 500 mg: ORAL | @ 03:00:00 | NDC 00378104501

## 2013-10-11 MED ADMIN — divalproex DR (DEPAKOTE) tablet 500 mg: ORAL | @ 13:00:00 | NDC 00378104501

## 2013-10-11 MED ADMIN — QUEtiapine (SEROquel) tablet 100 mg: ORAL | @ 03:00:00 | NDC 68084053211

## 2013-10-11 MED ADMIN — Thiamine Mononitrate (B-1) tablet 100 mg: ORAL | @ 13:00:00 | NDC 90011015050

## 2013-10-11 MED ADMIN — folic acid (FOLVITE) tablet 1 mg: ORAL | @ 13:00:00 | NDC 62584089711

## 2013-10-11 MED ADMIN — therapeutic multivitamin (THERAGRAN) tablet 1 Tab: ORAL | @ 13:00:00

## 2013-10-11 MED FILL — DIVALPROEX 500 MG TAB, DELAYED RELEASE: 500 mg | ORAL | Qty: 1

## 2013-10-11 MED FILL — THIAMINE MONONITRATE 100 MG TABLET: 100 mg | ORAL | Qty: 1

## 2013-10-11 MED FILL — THERA 400 MCG TABLET: 400 mcg | ORAL | Qty: 1

## 2013-10-11 MED FILL — SEROQUEL 100 MG TABLET: 100 mg | ORAL | Qty: 1

## 2013-10-11 MED FILL — FOLIC ACID 1 MG TAB: 1 mg | ORAL | Qty: 1

## 2013-10-11 NOTE — Progress Notes (Signed)
Verbal shift change report given to Tara, RN (oncoming nurse) by MAKEDA  WALI, RN   (offgoing nurse). Report included the following information SBAR, Kardex and MAR.

## 2013-10-11 NOTE — Other (Signed)
Bedside and Verbal shift change report given to  (oncoming nurse) by Eric A Amene, RN (offgoing nurse). Report included the following information SBAR, Kardex and MAR.

## 2013-10-11 NOTE — Progress Notes (Signed)
10/11/13 1130   Attitude and Behavior   General Attitude Attentive;Cooperative   Affect Labile   Mood Labile;Irritable   Insight Fair   Judgement Impaired   Memory  Intact   Thought Content Preoccupation   Hallucinations None   Delusions Other (comment)   Concentration Fair   Speech Pattern Unremarkable   Thought Process Circumstantial   Motor Activity Unremarkable   Pt alert and oriented x3.Pt med compliant.Pt denies S/I and H/I.Pt denies A/V hallucinations.Pt calmer and agreed to be assessed by assigned psychiatrist.Q15 minute checks maintained.Pt less irritable and remains cooperative.Preoccupied with making sure" aftercare with a therapist is in place and getting the medicine in his system".

## 2013-10-11 NOTE — Progress Notes (Signed)
10/10/13 2021   Attitude and Behavior   General Attitude Attentive;Defensive   Affect Labile;Angry   Mood Depressed   Insight Poor   Judgement Impaired   Memory  Impaired   Thought Content Loose association   Hallucinations None   Delusions Religious   Concentration Fair   Speech Pattern Unremarkable   Thought Process Circumstantial   Motor Activity Unremarkable   Assumed care of patient this shift. During 1:1 verbal interaction, alert and oriented x 3. Patient mainly isolative to room and withdrawn to self. Patient with irritable edge. Deny thoughts to harm self or others. Denies auditory or visual hallucinations. Noted that patient with ordered CIWA assessments Q4H when awake. Patient deny having signs of ETOH withdrawal. No symptoms of ETOH withdrawal observed. Patient voiced no complaints. Patient encouraged to come into dayroom. Refused. At this time, in bed resting. No sign or symptoms of distress. Nurse rounds and safety checks maintained. Noted that patient with lab test scheduled in a.m. Tawni LevyMakeda, RN.

## 2013-10-11 NOTE — Progress Notes (Signed)
Verbal shift change report given to Delice Bisonara, RN (oncoming nurse) by Christiane HaMAKEDA  WALI, RN   (offgoing nurse). Report included the following information SBAR, Kardex and MAR.

## 2013-10-11 NOTE — Progress Notes (Signed)
Psychiatric Inpatient Daily Progress Note    Admit Date: 10/10/2013      Subjective:     Per staff, patient has been calm, even yesterday evening after aggression with me regarding Xanax. This morning he states he is feeling better, and does not apologize overtly but is cooperative with me today. Able to smile when I discussed that he may be a bit "hardheaded" in addition to being manic. Smiled also when I complimented him for being off his meds. Per staff also, his wife visited last night and he had a good interaction with her. He states that he has been on many meds and that the regimen he has been on recently works, so had been upset yesterday because did not want it "changed". Admitted that he was not listening yesterday when I was trying to explain to him that meds were not being changed, but that the Xanax was just not being restarted. Admits that he does not listen when manic. Patient has been coloring and encouraging peers. Initially thought that coloring was "stupid" but when wife told him that it was therapeutic, then applied himself into doing so (all day in day room, asking to do it even when others not doing so). Refusing vitals and CIWA scoring, and also initially refused blood test but then did it when encouraged.    Current Facility-Administered Medications   Medication Dose Route Frequency   ??? haloperidol (HALDOL) tablet 5 mg  5 mg Oral Q4H PRN   ??? diphenhydrAMINE (BENADRYL) capsule 50 mg  50 mg Oral Q4H PRN   ??? haloperidol lactate (HALDOL) injection 5 mg  5 mg IntraMUSCular Q4H PRN   ??? diphenhydrAMINE (BENADRYL) injection 50 mg  50 mg IntraMUSCular Q4H PRN   ??? hydrOXYzine (VISTARIL) capsule 50 mg  50 mg Oral QHS PRN   ??? hydrOXYzine (VISTARIL) capsule 50 mg  50 mg Oral Q6H PRN   ??? acetaminophen (TYLENOL) tablet 650 mg  650 mg Oral Q4H PRN   ??? nicotine (NICORETTE) gum 2 mg  2 mg Oral PRN   ??? chlordiazePOXIDE (LIBRIUM) capsule 25 mg  25 mg Oral Q4H PRN    ??? therapeutic multivitamin (THERAGRAN) tablet 1 Tab  1 Tab Oral DAILY   ??? Thiamine Mononitrate (B-1) tablet 100 mg  100 mg Oral DAILY   ??? folic acid (FOLVITE) tablet 1 mg  1 mg Oral DAILY   ??? QUEtiapine (SEROquel) tablet 100 mg  100 mg Oral BID   ??? divalproex DR (DEPAKOTE) tablet 500 mg  500 mg Oral BID          Objective:     Patient Vitals for the past 8 hrs:   BP Temp Pulse Resp SpO2   10/11/13 0852 119/74 mmHg 98 ??F (36.7 ??C) 86 20 98 %             Mental Status Examination:    Appearance/Behavior: neat, still oppositional but more able to be encouraged. Participates a bit overvigorously even in activities he initially refused when explained as to their benefit. A bit childlike  Speech/Language: RRR, less rapid/loud today  Mood/Affect: less expansive/labile, but still a bit so  Thought Processes: linear/logical  Thought Content: no halluc, less suspiciousness today  Cognition: slightly less fund of knowledge  Insight/Judgement: still poor judgement but slightly improved.      Data Review   Recent Results (from the past 24 hour(s))   TSH, 3RD GENERATION    Collection Time: 10/11/13  8:40 AM   Result  Value Ref Range    TSH 0.79 0.55 - 7.78 uIU/mL           Assessment:     Principal Problem:    Bipolar 1 disorder (HCC) (10/10/2013)        Plan:     Patient is still labile and oppositional, but less so in that he is at least able to be encouraged now. Less suspicious so is able to trust our recommendations now, but only after initial refusals. Still needs more time inpatient as was just aggressive yesterday, has just been restarted on his meds and is still a bit impulsive/oppositiona. Meds won't achieve steady state until 6/27. Patient agrees.

## 2013-10-11 NOTE — Consults (Signed)
DATE OF ADMISSION: 10/10/2013    DATE OF CONSULTATION:    ATTENDING PHYSICIAN: Kevin DalesArchana Leon-Guerrera, MD.    PRIMARY CARE PROVIDER: The patient does not have one.    CONSULTING PHYSICIAN: Kevin MessingSteven Hamlette, MD.    REASON FOR CONSULTATION: Medical management.    HISTORY OF PRESENT ILLNESS: The patient is a 33 year old African American  male with past medical history significant for hypertension and bipolar  disorder who presented to the emergency department with complaint of  suicidal ideation and depression. The patient was admitted to the  psychiatric unit for further evaluation and management. Today, the patient  denies any suicidal or homicidal ideations. He also denies any chest pain,  shortness of breath, nausea, vomiting, diarrhea, or abdominal pain.    PAST MEDICAL HISTORY  1. Hypertension, well controlled without antihypertensive medication.  2. Bipolar disorder.  3. Tobacco abuse, continuous.  4. Alcohol abuse, continuous.    PAST SURGICAL HISTORY: The patient denies any.    FAMILY HISTORY: Positive for COPD, hypertension, and diabetes mellitus.    SOCIAL HISTORY: The patient smokes about 1 pack of cigarettes daily. He  drinks alcohol on a daily basis, he could not recall the quantity. He also  smokes marijuana daily.    MEDICATIONS  1. Depakote 500 mg by mouth daily.  2. Seroquel 100 mg by mouth daily.  3. Xanax 0.25 mg by mouth b.i.d.    ALLERGIES: NO KNOWN DRUG ALLERGIES.    REVIEW OF SYSTEMS: A 10-point review of systems was reviewed with the  patient and is otherwise negative, except as noted above in the HPI.    PHYSICAL EXAMINATION  VITAL SIGNS: Temperature 98, pulse 86, respiratory rate 20, blood pressure  119/74, oxygen saturation 98.  GENERAL: A well-developed, well-nourished 33 year old who appears to be in  no acute distress.  HEENT: Atraumatic, normocephalic. Pupils are equal, round and reactive to  light and accommodation. EOMs intact.   NECK: Supple, no JVD, no lymphadenopathy, no thyromegaly.  CHEST: No deformities customized.  CARDIOVASCULAR: S1, S2, regular rate and rhythm.  RESPIRATORY: Lungs clear to auscultation bilaterally. No wheezes or  rhonchi.  GI: Abdomen soft, nontender, nondistended. Positive bowel sounds.  MUSCULOSKELETAL: Moves all extremities.  EXTREMITIES: No edema, cyanosis, or clubbing.  PSYCHIATRIC: Appropriate mood.  SKIN: No apparent lesions.  NEUROLOGIC: The patient is alert and oriented x3. No apparent focal  deficits.    LABORATORY DATA: WBC 5.9, hemoglobin 14, hematocrit 39.4, platelets 195.  Sodium 142, potassium 3.6, chloride 104, CO2 of 26, anion gap 16, glucose  102, BUN 17, creatinine 1.1, calcium 8.9, total protein 7.9, ALT 20, AST  13, alkaline phosphatase 67. UA unremarkable. Urine toxicology positive for  cannabinol. Alcohol level 56. TSH 0.79. Valproic acid level was 0.    CODE STATUS: FULL CODE.    ASSESSMENT  1. History of hypertension, very well controlled without any  antihypertensive medications.  2. Cannabis abuse, continuous.  3. Alcohol abuse, continuous.  4. Bipolar disorder.  5. Tobacco abuse, continuous.  6. Suicidal ideations.  7. Depression.  Note all the above diagnoses were present on admission.    RECOMMENDATIONS: This is a patient with long-standing history of  hypertension but it is very well controlled without any antihypertensive  medications. The patient told me when he was incarcerated, he was taking  hydrochlorothiazide and amlodipine, but now his blood pressure is very well  controlled and he does not need any antihypertensive medication. Will  monitor very closely and will treat accordingly if  becomes elevated. The  patient counseled about substance abuse and he expressed understanding. For  alcohol abuse, will start the patient on multivitamin, folic acid,  thiamine, and will monitor very closely for any signs of alcohol   withdrawal. The patient is not on any alcohol withdrawal at this time. Will  continue to monitor. Will give nicotine gum as needed for tobacco  cessation. The patient counseled about tobacco cessation and he expressed  understanding. The psychiatric diagnoses mentioned above are being managed  by the psychiatric team.    On behalf of the [facility] hospitalist team, thank you for this consultation.        Kevin Geiselman PA-C        Dictated by: Kevin Coffey[Kevin Cull, PA-C Kevin Coffey]  Job#: [[id number]]: [[id number]]  DD: [10/11/2013 05:54 P]  DT: [10/11/2013 08:58 P]  Transcribed by: [wmx]    cc:            Kevin PrestoPatrick, PA-C Kevin Coffey                 Kevin Coffey

## 2013-10-11 NOTE — Consults (Signed)
Consult dictated. Job ID#: [id number]

## 2013-10-11 NOTE — Behavioral Health Treatment Team (Signed)
Patient refused morning v/s.  R. McKoy

## 2013-10-11 NOTE — Other (Signed)
Bedside shift change report given  by TARA C BUCHANAN, RN   (offgoing nurse).  Report given with SBAR.

## 2013-10-11 NOTE — Progress Notes (Signed)
Patient refused to have blood drawn this a.m for TSH. Patient teaching provided on reason for blood test. Patient refused teaching and continue to refuse blood drawn. Patient with irritable edge this a.m.

## 2013-10-12 MED ADMIN — divalproex DR (DEPAKOTE) tablet 500 mg: ORAL | @ 02:00:00 | NDC 00378104501

## 2013-10-12 MED ADMIN — divalproex DR (DEPAKOTE) tablet 500 mg: ORAL | @ 13:00:00 | NDC 00378104501

## 2013-10-12 MED ADMIN — nicotine (NICORETTE) gum 2 mg: ORAL | @ 14:00:00 | NDC 99999052401

## 2013-10-12 MED ADMIN — Thiamine Mononitrate (B-1) tablet 100 mg: ORAL | @ 13:00:00 | NDC 90011015050

## 2013-10-12 MED ADMIN — therapeutic multivitamin (THERAGRAN) tablet 1 Tab: ORAL | @ 13:00:00

## 2013-10-12 MED ADMIN — QUEtiapine (SEROquel) tablet 100 mg: ORAL | @ 02:00:00 | NDC 68084053211

## 2013-10-12 MED ADMIN — folic acid (FOLVITE) tablet 1 mg: ORAL | @ 13:00:00 | NDC 62584089711

## 2013-10-12 MED ADMIN — QUEtiapine (SEROquel) tablet 100 mg: ORAL | @ 13:00:00 | NDC 68084053211

## 2013-10-12 MED FILL — NICOTINE (POLACRILEX) 2 MG GUM: 2 mg | BUCCAL | Qty: 1

## 2013-10-12 MED FILL — DIVALPROEX 500 MG TAB, DELAYED RELEASE: 500 mg | ORAL | Qty: 1

## 2013-10-12 MED FILL — SEROQUEL 100 MG TABLET: 100 mg | ORAL | Qty: 1

## 2013-10-12 MED FILL — FOLIC ACID 1 MG TAB: 1 mg | ORAL | Qty: 1

## 2013-10-12 MED FILL — THIAMINE MONONITRATE 100 MG TABLET: 100 mg | ORAL | Qty: 1

## 2013-10-12 MED FILL — HYDROXYZINE PAMOATE 50 MG CAP: 50 mg | ORAL | Qty: 1

## 2013-10-12 MED FILL — THERA 400 MCG TABLET: 400 mcg | ORAL | Qty: 1

## 2013-10-12 NOTE — Progress Notes (Signed)
10/12/13 0106   Attitude and Behavior   General Attitude Cooperative   Affect Constricted   Mood Labile   Insight Fair   Judgement Impaired   Memory  Intact   Thought Content Unremarkable   Hallucinations None   Delusions None   Concentration Fair   Speech Pattern Unremarkable   Thought Process Unremarkable   Motor Activity Freedom of movement   Pt was alert and oriented X 3, affect was anxious , mood mixed. He spent some time on the phone told the writer he was talking to friends who care. He accepted all his medications and snacks and retired for the day thereafter.He denied SI/HI, AH/VH. There was no other significant event, he has been sleeping without interruption except for bathroom use. Q 15 checks was maintained.

## 2013-10-12 NOTE — Progress Notes (Signed)
Psychiatric Inpatient Daily Progress Note    Admit Date: 10/10/2013      Subjective:     Per staff, patient continues to color frequently. He requested discharge today rather than tomorrow after Depakote level , stating that his wife wants this as needs his help as the "man of the house".  However when I called wife (on speakerphone with patient at his authorization), she stated that she wanted him to stay. Patient was clearly misrepresenting her wishes, in an attempt to get discharged today rather than tomorrow.     Current Facility-Administered Medications   Medication Dose Route Frequency   ??? haloperidol (HALDOL) tablet 5 mg  5 mg Oral Q4H PRN   ??? diphenhydrAMINE (BENADRYL) capsule 50 mg  50 mg Oral Q4H PRN   ??? haloperidol lactate (HALDOL) injection 5 mg  5 mg IntraMUSCular Q4H PRN   ??? diphenhydrAMINE (BENADRYL) injection 50 mg  50 mg IntraMUSCular Q4H PRN   ??? hydrOXYzine (VISTARIL) capsule 50 mg  50 mg Oral QHS PRN   ??? hydrOXYzine (VISTARIL) capsule 50 mg  50 mg Oral Q6H PRN   ??? acetaminophen (TYLENOL) tablet 650 mg  650 mg Oral Q4H PRN   ??? nicotine (NICORETTE) gum 2 mg  2 mg Oral PRN   ??? chlordiazePOXIDE (LIBRIUM) capsule 25 mg  25 mg Oral Q4H PRN   ??? therapeutic multivitamin (THERAGRAN) tablet 1 Tab  1 Tab Oral DAILY   ??? Thiamine Mononitrate (B-1) tablet 100 mg  100 mg Oral DAILY   ??? folic acid (FOLVITE) tablet 1 mg  1 mg Oral DAILY   ??? QUEtiapine (SEROquel) tablet 100 mg  100 mg Oral BID   ??? divalproex DR (DEPAKOTE) tablet 500 mg  500 mg Oral BID         Objective:     Patient Vitals for the past 8 hrs:   BP Temp Pulse Resp SpO2   10/12/13 0755 124/72 mmHg 97.4 ??F (36.3 ??C) 61 17 100 %             Mental Status Examination:    Appearance/Behavior: neat, cooperative, misrepresenting facts  Speech/Language: RRR  Mood/Affect: neutral, slightly labile still but much less so  Thought Processes: linear/logical  Thought Content: no halluc/delus evident, No Si/HI  Cognition: grossly intact, but limited FOK   Insight/Judgement: good,still slightly impulsive but less so.        Data Review No results found for this or any previous visit (from the past 24 hour(s)).        Assessment:     Principal Problem:    Bipolar 1 disorder (HCC) (10/10/2013)        Plan:     Patient needs at least one more inpatient day for meds to achieve steady state, as is still a bit impulsive. Was aggressive just a few days ago, although no longer so. Depakote level is due tomorrow, with probable discharge at that point and discharge dose to be decided based on blood level.

## 2013-10-12 NOTE — Progress Notes (Signed)
10/12/13 1040   Attitude and Behavior   General Attitude Cooperative   Affect Flat   Mood Sad;Depressed   Insight Fair   Judgement Intact   Memory  Intact   Thought Content Preoccupation   Hallucinations None   Delusions None   Concentration Fair   Speech Pattern Unremarkable   Thought Process Unremarkable   Motor Activity Unremarkable   Pt alert and oriented x3.Pt denies S/I and H/I @ this time.Pt also denies A/V hallucinations.Pt calm and less irritable @ this time.Pt states he is looking forward to going home tomorrow.Q15 minute checks maintained.Pt states he is hopeful and he feels happy.

## 2013-10-12 NOTE — Other (Signed)
Bedside shift change report given  by TARA C BUCHANAN, RN   (offgoing nurse).  Report given with SBAR.

## 2013-10-13 LAB — LIPID PANEL
CHOL/HDL Ratio: 2.3
Cholesterol, total: 148 MG/DL (ref <200)
HDL Cholesterol: 64 MG/DL — ABNORMAL HIGH (ref 40–60)
LDL, calculated: 78.4 MG/DL (ref 0–130)
LDL/HDL Ratio: 1.2
Triglyceride: 28 MG/DL (ref 0–200)
VLDL, calculated: 5.6 MG/DL

## 2013-10-13 LAB — VALPROIC ACID: Valproic acid: 78 ug/ml (ref 50–100)

## 2013-10-13 MED ORDER — DIVALPROEX 500 MG TAB, DELAYED RELEASE
500 mg | ORAL_TABLET | Freq: Two times a day (BID) | ORAL | Status: DC
Start: 2013-10-13 — End: 2015-09-12

## 2013-10-13 MED ORDER — QUETIAPINE 100 MG TAB
100 mg | ORAL_TABLET | Freq: Two times a day (BID) | ORAL | Status: DC
Start: 2013-10-13 — End: 2015-09-12

## 2013-10-13 MED ADMIN — QUEtiapine (SEROquel) tablet 100 mg: ORAL | @ 01:00:00 | NDC 68084053211

## 2013-10-13 MED ADMIN — divalproex DR (DEPAKOTE) tablet 500 mg: ORAL | @ 13:00:00 | NDC 00378104501

## 2013-10-13 MED ADMIN — QUEtiapine (SEROquel) tablet 100 mg: ORAL | @ 13:00:00 | NDC 68084053211

## 2013-10-13 MED ADMIN — divalproex DR (DEPAKOTE) tablet 500 mg: ORAL | @ 01:00:00 | NDC 62756079888

## 2013-10-13 MED ADMIN — nicotine (NICORETTE) gum 2 mg: ORAL | @ 14:00:00 | NDC 99999052401

## 2013-10-13 MED FILL — DIVALPROEX 500 MG TAB, DELAYED RELEASE: 500 mg | ORAL | Qty: 1

## 2013-10-13 MED FILL — NICOTINE (POLACRILEX) 2 MG GUM: 2 mg | BUCCAL | Qty: 1

## 2013-10-13 MED FILL — SEROQUEL 100 MG TABLET: 100 mg | ORAL | Qty: 1

## 2013-10-13 NOTE — Other (Signed)
Nursing Discharge Notes: This is a 33 yr old A A male [facility]. Pt has been compliant with treatment plan, calm and cooperative,interactive with peers, attended groups while on the unit. Pt is alert and oriented to person, place, time and situation. Dr. Freida BusmanAllen put in a discharge order for pt to be discharged today 10/13/2013. Pt is discharged to home @ [address], [place] [id number] Tel [phone] with [facility] [address], [place] [id number].Pt has an Appt. On July 1st 2015 @ 12:45pm with Dr. Selena BattenKim.The following [facility]; adjunctive therapy, milieu therapy, 1:1 daily interaction with nurses, techs, social workers and Doctors. Pt verbalized understanding of discharge and aftercare planning, medication management/teaching. Medication scripts provided, tokens for transportation provided, pt's belongings return, pt accompanied off the unit by staff @ about 10:30 Am.

## 2013-10-13 NOTE — Progress Notes (Signed)
10/13/13 0046   Attitude and Behavior   General Attitude Cooperative   Affect Labile   Mood Anxious   Insight Fair   Judgement Intact   Memory  Intact   Thought Content Unremarkable   Hallucinations None   Delusions None   Concentration Fair   Speech Pattern Unremarkable   Thought Process Unremarkable   Motor Activity Freedom of movement   Pt was alert and oriented X 3, he spent sometime in the milieu talking on the phone, he was redirected about monopolizing the phone.He accepted his medication and stated that he will be going home today to be with his beloved wife and family. Pt denies SI/HI, he had a restful night, Q 15 minutes checks maintained all night.

## 2013-10-13 NOTE — Other (Signed)
Bedside shift change report given by Timothy Udoji, RN   (offgoing nurse).  Report given with SBAR, Kardex and MAR.

## 2013-10-15 LAB — HEMOGLOBIN A1C WITH EAG: Hemoglobin A1c: 5.2 % (ref 4.4–6.4)

## 2013-11-18 NOTE — Discharge Summary (Signed)
[facility]  Inpatient Behavioral Health Services    Discharge Summary    Patient: Kevin Coffey MRN: [id number]  SSN: [id number]    Date of Birth: Feb 03, 1981  Age: 33 y.o.  Sex: male        Admit Date: 10/10/2013    Discharge date: 10/13/13    Admission Diagnoses: Depression    Discharge Diagnoses:   Problem List as of 10/13/2013 Date Reviewed: 10/12/2013        ICD-9-CM Class Noted - Resolved    * (Principal)Bipolar 1 disorder (HCC) 296.7  10/10/2013 - Present              Discharged Condition: good    History of Present Illness and Reason for Admission: copied from H/P-Copied from ER SW note: Pt is a 33 year old ,married ,AA, male who called 911 and was bought to the ER by ambo. Pt reported at triage that he was feeling SI with no plan. Marland Kitchen Pt presented alert and oriented times four. Pt report that he was seeking help because he was feeling out of control since he threw away his psych medications Depakote, Seroquel and Xanax after his visit with his Psychiatrist, [name] at the beginning of this month.Pt sees [name] at the [facility] on [address]. Pt stated that he felt that he did not need the medication because he was feeling "well". A couple of days ago ,the Pt stated that he visited the ER at [facility] but left before he was seen. Pt reports that his wife told him to get check out because he is not sleeping well,having racing thoughts ,talking loud and yelling at everybody at home and being aggressive toward everyone Pt reports that his next appointment with [name] is on 10/12/13, and he feels he can not wait ,he needs help now . The Pt has poor insight and judgement about his illness. Pt reports that his last in-pt for psych was three years ago in [place]. Pt reports that at that time on his admission to the psych ward, he was manic. Pt is now reporting that he is not SI, but wasn't sure about HI. Pt denies  hallucinations. This Pt receives SSI for his mental illness . The Pt's tox screen is positive for marijuana . Pt state,s he does not use marijuana. Pt's alcohol at triage was 56 . Pt reports he had a small drink on 10/09/13. Pt is reporting no medical problems. Other information about the Pt: Pt lives with his wife and two of his children .another child is in college. /Pt was born in [place] and around the age of [age] he was placed in [facility] until he aged out. /Pt reports that he is the youngest of three children/Pt has some college education/ Pt is on probation for the unauthorization of a car.and Pt named his supports as his wife ,his Aunt and Sister-in Law/ Pt signed the voluntary form for admission.    My addendum: Patient called 911 as he was feeling that he needed to be in the hospital to get back on his medicine. States he gets argumentative, "I don't care", depressed, isolative, thinks people out to get him. States this is not happening currently, but his behavior is affecting his marriage. His demeanor was irritable, possibly suspicious in that he insisted on closing the door when he saw me in my office (despite my stated preference to keep it open due to ventilation),  loud voice that escalated when I stated that I would start him back on all his meds except Xanax, inability to participate in discussion about this due to overtalking me and not listening. When I tried to escort him out of the office stating that I was not going to discuss this anymore, he flipped over a chair and cursed and had to be escorted out by staff. No prn meds had to be given as he calmed down later by going to his room and with a show of force by security.      Hospital Course: Patient was quite manic, and initially easily agitated. However he calmed quickly upon getting back on Depakote. Family was supportive and encouraged him to follow the treatment plan, which  initially he did not want to do, despite seeking treatment himself voluntarily. By the time of discharge, his mood was much improved, without dangerous ideation/behavior, and Depakote level was therapeutic, per Dr. Freida BusmanAllen who discharged patient.    Discharge Diagnostic Impression    Axis I: Bipolar Disorder, Manic Phase. 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 Psychiatric disorder      Depression   ??? Aggressive outburst    ??? Anxiety disorder    ??? Depression      Axis IV: social  Axis V: 55    Allergies: No Known Allergies     Discharge medications:   No current facility-administered medications for this encounter.     Current Outpatient Prescriptions   Medication Sig   ??? divalproex DR (DEPAKOTE) 500 mg tablet Take 1 Tab by mouth two (2) times a day.   ??? QUEtiapine (SEROQUEL) 100 mg tablet Take 1 Tab by mouth two (2) times a day.       Discharge Mental Status Exam---as reported by Dr. Freida BusmanAllen who discharged patient  Appearance   General Behavior Neat, casual   Speech form and content  Language  Associations  Form of Thought RRR, linear/logical   Mood, Affect  Self-Attitude  Vital Sense  SI/HI/PDW Neutral, no SI/HI/PDW   Abnormal Perceptions and illusions Hallucinations:  none   Delusions none   Anxiety none   COGNITION Intelligence Abstraction A, Ox3, att/conc/memory grossly intact   Judgement Insight Good/good     Lab Results:  From referring ER:     Procedures Performed: No results found.    Discharge Disposition: home      Follow-up Care: No orders of the defined types were placed in this encounter.     Follow-up Information    Follow up With Details Comments Contact Info      Pt will follow-up @ [facility] [address] [place] [id number]. Appointment on October 17, 2013 @ 12:45pm with Dr. Selena BattenKim Pt will return to home @ [address] [place] [phone] with Family/ Phone [phone]              Signed By: Coletta MemosArchana Leon-Guerrero, MD      November 18, 2013

## 2015-09-11 ENCOUNTER — Inpatient Hospital Stay
Admit: 2015-09-11 | Discharge: 2015-09-15 | Disposition: A | Discharge status: Home / Self Care | Payer: MEDICAID | Attending: Internal Medicine | Admitting: Internal Medicine

## 2015-09-11 DIAGNOSIS — F319 Bipolar disorder, unspecified: Secondary | ICD-10-CM

## 2015-09-11 LAB — METABOLIC PANEL, COMPREHENSIVE
A-G Ratio: 1.1 (ref 1.0–3.1)
ALT (SGPT): 25 U/L (ref 12.0–78.0)
AST (SGOT): 22 U/L (ref 15–37)
Albumin: 3.9 g/dL (ref 3.40–5.00)
Alk. phosphatase: 73 U/L (ref 46–116)
Anion gap: 15 mmol/L (ref 10–17)
BUN/Creatinine ratio: 23 — ABNORMAL HIGH (ref 6.0–20.0)
BUN: 21 MG/DL — ABNORMAL HIGH (ref 7–18)
Bilirubin, total: 0.5 MG/DL (ref 0.20–1.00)
CO2: 27 mmol/L (ref 21–32)
Calcium: 8.9 MG/DL (ref 8.5–10.1)
Chloride: 104 mmol/L (ref 98–107)
Creatinine: 0.92 MG/DL (ref 0.6–1.3)
GFR est AA: >60 mL/min/{1.73_m2} (ref >60)
GFR est non-AA: >60 mL/min/{1.73_m2} (ref >60)
Globulin: 3.7 g/dL
Glucose: 75 mg/dL (ref 74–106)
Potassium: 4.1 mmol/L (ref 3.50–5.10)
Protein, total: 7.6 g/dL (ref 6.40–8.20)
Sodium: 142 mmol/L (ref 136–145)

## 2015-09-11 LAB — CBC WITH AUTOMATED DIFF
ABS. BASOPHILS: 0 10*3/uL (ref 0.0–0.2)
ABS. EOSINOPHILS: 0 10*3/uL (ref 0.0–0.7)
ABS. LYMPHOCYTES: 1.8 10*3/uL (ref 1.2–3.4)
ABS. MONOCYTES: 0.2 10*3/uL — ABNORMAL LOW (ref 1.1–3.2)
ABS. NEUTROPHILS: 1.5 10*3/uL (ref 1.4–6.5)
BASOPHILS: 0 % (ref 0–2)
EOSINOPHILS: 1 % (ref 0–5)
HCT: 38.9 % (ref 36.8–45.2)
HGB: 13.7 g/dL (ref 12.8–15.0)
IMMATURE GRANULOCYTES: 0.3 % (ref 0.0–5.0)
LYMPHOCYTES: 51 % — ABNORMAL HIGH (ref 16–40)
MCH: 27.7 PG (ref 27–31)
MCHC: 35.2 g/dL (ref 32–36)
MCV: 78.7 FL — ABNORMAL LOW (ref 81–99)
MONOCYTES: 6 % (ref 0–12)
MPV: 9.9 FL (ref 7.4–10.4)
NEUTROPHILS: 42 % (ref 40–70)
NRBC: 0 PER 100 WBC
PLATELET: 187 10*3/uL (ref 140–450)
RBC: 4.94 M/uL (ref 4.0–5.2)
RDW: 15.1 % — ABNORMAL HIGH (ref 11.5–14.5)
WBC: 3.5 10*3/uL — ABNORMAL LOW (ref 4.8–10.8)

## 2015-09-11 LAB — ETHYL ALCOHOL: ALCOHOL(ETHYL),SERUM: <3 MG/DL (ref <3.0)
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
## 2015-09-11 NOTE — ED Notes (Signed)
This is a back timed note: the pt is resting quietly, watching TV. He remains calm and cooperative. Security is at the bedside for Safety. Pending dispo.

## 2015-09-11 NOTE — Progress Notes (Signed)
Physical Exam   Skin:             CONTRABAND SEARCH:    Patient just arrived to unit from the ED, Contraband search done in the presence of officer Dimas AguasHoward and proved negative. No safety or mgmt issues. Will continue to monitor.

## 2015-09-11 NOTE — Other (Signed)
TRANSFER - OUT REPORT:    Verbal report given to RN Cordelia PenEthel (name) on Kevin Coffey  [facility] for routine progression of care       Report consisted of patient???s Situation, Background, Assessment and   Recommendations(SBAR).     Information from the following report(s) SBAR was reviewed with the receiving nurse.    Lines:       Opportunity for questions and clarification was provided.      Patient transported with:   [employer]

## 2015-09-11 NOTE — ED Provider Notes (Signed)
HPI Comments: Kevin Coffey is a 35 y.o. male with h/o anxiety disorder and depression who presents to the ED with complaint of worsening depression and aggression. He states he has been "crying more frequently and throwing stuff in his house." Pt states he was released from prison and ran out of his medication last December. He has not taken his Depakote and Seroquel prescription since then. Pt denies fever, HA, N/V/D, AH/VH, SI/HI.  Pt denies recent EtOH and illicit drug use.     Patient is a 35 y.o. male presenting with mental health disorder. The history is provided by the patient.   Mental Health Problem    This is a new problem. The current episode started more than 1 week ago. The problem has not changed since onset.Associated symptoms include agitation and violence. Pertinent negatives include no weakness and no hallucinations. His past medical history is significant for depression.        Past Medical History:   Diagnosis Date   ??? Aggressive outburst    ??? Anxiety disorder    ??? Depression    ??? Psychiatric disorder     Depression       History reviewed. No pertinent surgical history.      History reviewed. No pertinent family history.    Social History     Social History   ??? Marital status: MARRIED     Spouse name: N/A   ??? Number of children: N/A   ??? Years of education: N/A     Occupational History   ??? Not on file.     Social History Main Topics   ??? Smoking status: Current Every Day Smoker     Packs/day: 0.50   ??? Smokeless tobacco: Not on file   ??? Alcohol use Yes   ??? Drug use: No   ??? Sexual activity: Yes     Partners: Female     Other Topics Concern   ??? Not on file     Social History Narrative         ALLERGIES: Review of patient's allergies indicates no known allergies.    Review of Systems   Constitutional: Negative.  Negative for chills and fever.   HENT: Negative.  Negative for congestion and rhinorrhea.    Respiratory: Negative.  Negative for cough and shortness of breath.     Cardiovascular: Negative.  Negative for chest pain.   Gastrointestinal: Negative.  Negative for abdominal pain, diarrhea, nausea and vomiting.   Genitourinary: Negative.  Negative for dysuria, frequency and hematuria.   Musculoskeletal: Negative.  Negative for myalgias.   Skin: Negative.  Negative for wound.   Allergic/Immunologic: Negative.  Negative for immunocompromised state.   Neurological: Negative.  Negative for weakness and headaches.   Psychiatric/Behavioral: Positive for agitation, behavioral problems and sleep disturbance. Negative for hallucinations and suicidal ideas.   All other systems reviewed and are negative.      Vitals:    09/11/15 1653   BP: 124/71   Pulse: 81   Resp: 20   Temp: 99 ??F (37.2 ??C)   SpO2: 98%            Physical Exam   Constitutional: He is oriented to person, place, and time. He appears well-developed and well-nourished.   HENT:   Head: Normocephalic and atraumatic.   Eyes: Conjunctivae and EOM are normal. Pupils are equal, round, and reactive to light.   Neck: Normal range of motion. Neck supple.   Cardiovascular: Normal rate, regular  rhythm, S1 normal, S2 normal, normal heart sounds and intact distal pulses.  Exam reveals no gallop and no friction rub.    No murmur heard.  Pulmonary/Chest: Effort normal and breath sounds normal. No respiratory distress. He has no wheezes. He has no rales.   Abdominal: Soft. Bowel sounds are normal. He exhibits no distension and no mass. There is no tenderness. There is no rebound and no guarding.   Musculoskeletal: Normal range of motion.   Neurological: He is alert and oriented to person, place, and time.   Skin: Skin is warm and dry.   Psychiatric: His behavior is normal. Judgment and thought content normal. He exhibits a depressed mood. He expresses no homicidal and no suicidal ideation.   Poor insight   Nursing note and vitals reviewed.       MDM  Number of Diagnoses or Management Options  Bipolar 1 disorder [facility]):    Diagnosis management comments: 35 y.o. male with hx of bipolar disorder presents with report of uncontrolled manic and depressive sxs, off med for several months. No substance use. Concerned for uncontrolled bipolar disorder. No signs of acute medical condition at this time.       Plan:   Labs: CBC, CMP, Utox, UA, EtOH  Psych/social worker consult   -----  5:27 PM  Documented by Ave Filter, acting as a scribe for Dr. Carmie Kanner, MD     PROVIDER ATTESTATION:  10:59 AM    The entirety of this note, signed by me, accurately reflects all works, treatments, procedures, and medical decision making performed by me, Carmie Kanner, MD.           Patient Progress  Patient progress: stable    ED Course     Diagnostic Studies:      Lab Data:  Labs Reviewed   METABOLIC PANEL, COMPREHENSIVE - Abnormal; Notable for the following:        Result Value    BUN 21 (*)     BUN/Creatinine ratio 23 (*)     All other components within normal limits   CBC WITH AUTOMATED DIFF - Abnormal; Notable for the following:     WBC 3.5 (*)     MCV 78.7 (*)     RDW 15.1 (*)     LYMPHOCYTES 51 (*)     ABS. MONOCYTES 0.2 (*)     All other components within normal limits   ETHYL ALCOHOL   URINALYSIS W/ RFLX MICROSCOPIC   DRUG SCREEN, URINE         ED Course:   8:49 PM  After psych/social evaluation, pt will be admitted for bipolar disorder.     Procedures
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
## 2015-09-12 LAB — LIPID PANEL
CHOL/HDL Ratio: 1.8
Cholesterol, total: 136 MG/DL (ref <200)
HDL Cholesterol: 76 MG/DL — ABNORMAL HIGH (ref 40–60)
LDL, calculated: 44.6 MG/DL (ref 0–130)
LDL/HDL Ratio: 0.6
Triglyceride: 77 MG/DL (ref 0–200)
VLDL, calculated: 15.4 MG/DL

## 2015-09-12 LAB — HEMOGLOBIN A1C W/O EAG: Hemoglobin A1c: 5.9 % (ref 4.5–6.2)

## 2015-09-12 LAB — TSH 3RD GENERATION: TSH: 0.82 u[IU]/mL (ref 0.55–7.78)

## 2015-09-12 MED ORDER — IPRATROPIUM-ALBUTEROL 2.5 MG-0.5 MG/3 ML NEB SOLUTION
2.5 mg-0.5 mg/3 ml | Freq: Four times a day (QID) | RESPIRATORY_TRACT | Status: DC | PRN
Start: 2015-09-12 — End: 2015-09-14

## 2015-09-12 MED ORDER — HYDROXYZINE PAMOATE 50 MG CAP
50 mg | Freq: Four times a day (QID) | ORAL | Status: DC | PRN
Start: 2015-09-12 — End: 2015-09-15
  Administered 2015-09-12 – 2015-09-15 (×3): via ORAL

## 2015-09-12 MED ORDER — DICYCLOMINE 10 MG CAP
10 mg | Freq: Four times a day (QID) | ORAL | Status: DC | PRN
Start: 2015-09-12 — End: 2015-09-15

## 2015-09-12 MED ORDER — KETOCONAZOLE 2 % TOPICAL CREAM
2 % | Freq: Two times a day (BID) | CUTANEOUS | Status: DC | PRN
Start: 2015-09-12 — End: 2015-09-15

## 2015-09-12 MED ORDER — QUETIAPINE 200 MG TAB
200 mg | Freq: Three times a day (TID) | ORAL | Status: DC
Start: 2015-09-12 — End: 2015-09-12

## 2015-09-12 MED ORDER — CHLORHEXIDINE GLUCONATE 0.12 % MOUTHWASH
0.12 % | Freq: Two times a day (BID) | Status: DC | PRN
Start: 2015-09-12 — End: 2015-09-15

## 2015-09-12 MED ORDER — BISACODYL 5 MG TAB, DELAYED RELEASE
5 mg | Freq: Every day | ORAL | Status: DC | PRN
Start: 2015-09-12 — End: 2015-09-15

## 2015-09-12 MED ORDER — DIPHENHYDRAMINE 50 MG CAP
50 mg | Freq: Every evening | ORAL | Status: DC | PRN
Start: 2015-09-12 — End: 2015-09-15
  Administered 2015-09-13 – 2015-09-15 (×3): via ORAL

## 2015-09-12 MED ORDER — ACETAMINOPHEN 500 MG TAB
500 mg | Freq: Four times a day (QID) | ORAL | Status: DC | PRN
Start: 2015-09-12 — End: 2015-09-15

## 2015-09-12 MED ORDER — DIVALPROEX 500 MG TAB, DELAYED RELEASE
500 mg | Freq: Two times a day (BID) | ORAL | Status: DC
Start: 2015-09-12 — End: 2015-09-15
  Administered 2015-09-13 – 2015-09-15 (×5): via ORAL

## 2015-09-12 MED ORDER — THIAMINE MONONITRATE 100 MG TABLET
100 mg | ORAL | Status: DC
Start: 2015-09-12 — End: 2015-09-14
  Administered 2015-09-12: 18:00:00 via ORAL

## 2015-09-12 MED ORDER — DIPHENOXYLATE-ATROPINE 2.5 MG-0.025 MG TAB
Freq: Four times a day (QID) | ORAL | Status: DC | PRN
Start: 2015-09-12 — End: 2015-09-15

## 2015-09-12 MED ORDER — OLANZAPINE 5 MG TAB, RAPID DISSOLVE
5 mg | Freq: Four times a day (QID) | ORAL | Status: DC | PRN
Start: 2015-09-12 — End: 2015-09-15
  Administered 2015-09-15: 12:00:00 via ORAL

## 2015-09-12 MED ORDER — DIPHENHYDRAMINE 50 MG CAP
50 mg | Freq: Four times a day (QID) | ORAL | Status: DC | PRN
Start: 2015-09-12 — End: 2015-09-15
  Administered 2015-09-15: 12:00:00 via ORAL

## 2015-09-12 MED ORDER — GUAIFENESIN 100 MG/5 ML ORAL LIQUID
100 mg/5 mL | ORAL | Status: DC | PRN
Start: 2015-09-12 — End: 2015-09-15

## 2015-09-12 MED ORDER — CLONIDINE 0.1 MG TAB
0.1 mg | ORAL | Status: DC | PRN
Start: 2015-09-12 — End: 2015-09-15
  Administered 2015-09-12 – 2015-09-15 (×3): via ORAL

## 2015-09-12 MED ORDER — DIVALPROEX 500 MG TAB, DELAYED RELEASE
500 mg | Freq: Once | ORAL | Status: AC
Start: 2015-09-12 — End: 2015-09-11
  Administered 2015-09-12: 04:00:00 via ORAL

## 2015-09-12 MED ORDER — LORAZEPAM 2 MG TAB
2 mg | ORAL | Status: DC | PRN
Start: 2015-09-12 — End: 2015-09-15
  Administered 2015-09-13 – 2015-09-15 (×4): via ORAL

## 2015-09-12 MED ORDER — DIVALPROEX 500 MG TAB, DELAYED RELEASE
500 mg | Freq: Three times a day (TID) | ORAL | Status: DC
Start: 2015-09-12 — End: 2015-09-12
  Administered 2015-09-12 (×2): via ORAL

## 2015-09-12 MED ORDER — FAMOTIDINE 20 MG TAB
20 mg | Freq: Two times a day (BID) | ORAL | Status: DC | PRN
Start: 2015-09-12 — End: 2015-09-15

## 2015-09-12 MED ORDER — FOLIC ACID 1 MG TAB
1 mg | Freq: Every day | ORAL | Status: DC
Start: 2015-09-12 — End: 2015-09-15
  Administered 2015-09-13 – 2015-09-15 (×3): via ORAL

## 2015-09-12 MED ORDER — THERAPEUTIC MULTIVITAMIN TAB
Freq: Every day | ORAL | Status: DC
Start: 2015-09-12 — End: 2015-09-15
  Administered 2015-09-13 – 2015-09-15 (×3): via ORAL

## 2015-09-12 MED ORDER — QUETIAPINE 200 MG TAB
200 mg | Freq: Every evening | ORAL | Status: DC
Start: 2015-09-12 — End: 2015-09-15
  Administered 2015-09-13 – 2015-09-15 (×3): via ORAL

## 2015-09-12 MED ORDER — ALUM-MAG HYDROXIDE-SIMETH 200 MG-200 MG-20 MG/5 ML ORAL SUSP
200-200-20 mg/5 mL | Freq: Three times a day (TID) | ORAL | Status: DC | PRN
Start: 2015-09-12 — End: 2015-09-15

## 2015-09-12 MED ORDER — WHITE PETROLATUM TOPICAL GEL
CUTANEOUS | Status: DC | PRN
Start: 2015-09-12 — End: 2015-09-15

## 2015-09-12 MED ORDER — ONDANSETRON 4 MG TAB, RAPID DISSOLVE
4 mg | Freq: Four times a day (QID) | ORAL | Status: DC | PRN
Start: 2015-09-12 — End: 2015-09-15

## 2015-09-12 MED ORDER — BENZOCAINE-MENTHOL 15 MG-3.6 MG LOZENGES
Status: DC | PRN
Start: 2015-09-12 — End: 2015-09-15

## 2015-09-12 MED ORDER — MAGNESIUM HYDROXIDE 400 MG/5 ML ORAL SUSP
400 mg/5 mL | Freq: Every evening | ORAL | Status: DC | PRN
Start: 2015-09-12 — End: 2015-09-15

## 2015-09-12 MED ORDER — QUETIAPINE 200 MG TAB
200 mg | Freq: Once | ORAL | Status: AC
Start: 2015-09-12 — End: 2015-09-11
  Administered 2015-09-12: 04:00:00 via ORAL

## 2015-09-12 MED ORDER — NICOTINE (POLACRILEX) 2 MG GUM
2 mg | BUCCAL | Status: DC | PRN
Start: 2015-09-12 — End: 2015-09-15
  Administered 2015-09-14: via ORAL

## 2015-09-12 MED FILL — CLONIDINE 0.1 MG TAB: 0.1 mg | ORAL | Qty: 1

## 2015-09-12 MED FILL — DIVALPROEX 500 MG TAB, DELAYED RELEASE: 500 mg | ORAL | Qty: 1

## 2015-09-12 MED FILL — WHITE PETROLEUM JELLY TOPICAL: CUTANEOUS | Qty: 16.8

## 2015-09-12 MED FILL — HYDROXYZINE PAMOATE 50 MG CAP: 50 mg | ORAL | Qty: 1

## 2015-09-12 MED FILL — QUETIAPINE 200 MG TAB: 200 mg | ORAL | Qty: 1

## 2015-09-12 NOTE — Consults (Signed)
Consults by Angela Cox, NP at 09/12/15 1221                Author: Angela Cox, NP  Service: Internal Medicine  Author Type: Nurse Practitioner       Filed: 09/12/15 1326  Date of Service: 09/12/15 1221  Status: Addendum          Editor: Angela Cox, NP (Nurse Practitioner)          Related Notes: Original Note by Angela Cox, NP (Nurse Practitioner) filed at 09/12/15 1224            Consult Orders        1. IP CONSULT TO INTERNAL MEDICINE [[id number]] ordered by Dala Dock, MD at 09/11/15 2326                                                   Hospitalist Consultation H&P Note      NAME:  Kevin Coffey     DOB:   07-08-1980    MRN:   [id number]        ATTENDING: Dala Dock, MD   Consulting Provider: Dr. Gay Filler   PCP:  None      Date/Time:   09/12/2015 12:21 PM        Subjective:     REQUESTING PHYSICIAN: Dr. Valentina Lucks   REASON FOR CONSULT:  Medical Management     Kevin Coffey is a 35 y.o.   African American male who I was asked to see for medical management. Pt a/o x3. Pt able to answer simple questions and follows commands. 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 Pt begins to raise his voice when he discusses the psych unit and feels it is "disorganized" and he feels like he has no control. Denies SI, HI and hallucinations. Denies  SOB, CP, palpitations, N/V/D.      REVIEW OF SYSTEMS:     All other systems negative on 10 system reviewed are normal except for as stated in HPI.        Past Medical History:        Diagnosis  Date         ?  Aggressive outburst       ?  Anxiety disorder       ?  Asthma       ?  Depression       ?  Psychiatric disorder            Depression            History reviewed. No pertinent surgical history.         SOCIAL HISTORY:     Social History          Social History         ?  Marital status:  MARRIED              Spouse name:  N/A         ?  Number of children:  N/A         ?  Years of education:  N/A          Social  History Main Topics         ?  Smoking status:  Current Every  Day Smoker              Packs/day:  0.50         Years:  20.00         Types:  Cigarettes         ?  Smokeless tobacco:  Never Used         ?  Alcohol use  Yes         ?  Drug use:  1.00 per week              Special:  Marijuana         ?  Sexual activity:  Yes              Partners:  Female           Other Topics  Concern        ?  None          Social History Narrative           Family History: Significant for HTN, DM, Cancer- bone, breast, cervical         No Known Allergies         Prior to Admission medications             Medication  Sig  Start Date  End Date  Taking?  Authorizing Provider            ALPRAZolam Prudy Feeler(XANAX) 0.5 mg tablet  Take 0.5 mg by mouth two (2) times daily as needed for Anxiety.        Phys Other, MD     divalproex DR (DEPAKOTE) 500 mg tablet  Take 1 Tab by mouth two (2) times a day.  10/13/13      Reather Littlerhonda Allen, MD            QUEtiapine (SEROQUEL) 100 mg tablet  Take 1 Tab by mouth two (2) times a day.  10/13/13      Reather Littlerhonda Allen, MD                   Objective:     VITALS:       Visit Vitals         ?  BP  101/61 (BP Patient Position: At rest)     ?  Pulse  62     ?  Temp  98.4 ??F (36.9 ??C)     ?  Resp  18     ?  Ht  5\' 8"  (1.727 m)     ?  Wt  63.5 kg (140 lb)     ?  SpO2  98%         ?  BMI  21.29 kg/m2        Temp (24hrs), Avg:98.4 ??F (36.9 ??C), Min:97.9 ??F (36.6 ??C), Max:99 ??F (37.2 ??C)         PHYSICAL EXAM:           General:    HEENT:   No acute distress. Appears stated age.   Neck supple. Trachea midline. PEERLA, no icterus.        Lungs:      Clear to auscultation bilaterally. No wheezing/rales/rhonchi. No accessory muscle use. No tachypnea.        Chest wall:    No tenderness or deformity.        Heart:   Regular rate and rhythm, S1, S2 normal, no murmurs, clicks, rubs or gallops. No JVD.  No carotid bruits.         Abdomen:    Soft, non-tender/non-distended. Bowel sounds normal. No masses,  No organomegaly. No guarding or  rigidity     Extremities:  Extremities normal, atraumatic, no cyanosis or edema.     Pulses:  2+ and symmetric all extremities.     Skin:  Skin color, texture, turgor normal. No rashes or lesions     Neurologic:        CNII-XII intact. No focal motor or sensory deficits appreciated. AAOx3        LAB DATA REVIEWED:       Recent Labs            09/11/15    1708     WBC   3.5*     HGB   13.7     HCT   38.9        PLT   187          Recent Labs            09/11/15    1708     NA   142     K   4.1     CL   104     CO2   27     GLU   75     BUN   21*     CREA   0.92     CA   8.9     ALB   3.9     SGOT   22        ALT   25        No results for input(s): PH, PCO2, PO2, HCO3, FIO2 in the last 72 hours.      IMAGING RESULTS:   No results found.      REVIEW OF PREVIOUS RECORDS:        Recommendations/Plan:          Assessment       Hospital Problems   Date Reviewed:  09-23-2015                   Codes  Class  Noted  POA        * (Principal)Bipolar 1 disorder (HCC)  ICD-10-CM: F31.9   ICD-9-CM: 296.7    10/10/2013  Yes                Major depressive disorder  ICD-10-CM: F32.9   ICD-9-CM: 296.20    09/23/2015  Yes                Essential hypertension  ICD-10-CM: I10   ICD-9-CM: 401.9    Sep 23, 2015  Yes                Alcohol abuse, continuous  ICD-10-CM: F10.10   ICD-9-CM: 305.01    September 23, 2015  Yes                Mild intermittent asthma without complication  ICD-10-CM: J45.20   ICD-9-CM: 493.90    2015-09-23  Yes                Tobacco use  ICD-10-CM: Z72.0   ICD-9-CM: 305.1    2015-09-23  Yes                Marijuana use  ICD-10-CM: F12.10   ICD-9-CM: 305.20    09-23-15  Yes  ___________________________________________________   PLAN/RECOMMENDATIONS:     1.  All above psych diagnoses to be managed by attending psych team.   2.  HTN- pt has history of hypertension, but is well controlled without medication outpatient. F/u vitals.    3.  Asthma- no acute exacerbation. PRN duonebs. F/u pulse ox.    4.   Alcohol abuse- to be managed by psych team. Pt counseled to stop drinking.    5.  Tobacco use- pt counseled to stop smoking. Nicorette gum PRN.    6.  Marijuana abuse- to be managed by psych team. Pt counseled to stop marijuana abuse.    7.  Pt to follow up outpatient with PCP.         Will sign off at this time. Please call with questions.      Total Time :  35 minutes   ___________________________________________________      Hospitalist: Angela Cox, NP      09/12/2015

## 2015-09-12 NOTE — Progress Notes (Signed)
Problem: Suicide/Homicide (Adult/Pediatric)  Goal: *STG: Remains safe in hospital  Outcome: Progressing Towards Goal     Pt remains safe while on unit this shift

## 2015-09-12 NOTE — Progress Notes (Signed)
09/12/15 0547   Sleep Pattern   Sleep Pattern Normal   Usual # of Hours of Sleep/Night 3   Current # of Hours of Sleep/Night 5   Pt remained in bed during the night and slept very well during the night. Pt continues to endorsed feelings of depression, but denies SI/HI. Q 15 minutes checks maintained and pt made comfortable.

## 2015-09-12 NOTE — Progress Notes (Signed)
Bedside and Verbal shift change report given  by Hilary Ottley, RN   (offgoing nurse).  Report given with SBAR.

## 2015-09-12 NOTE — Progress Notes (Signed)
Pt denies all psychiatric symptoms:  SI/HI, A/V hallucinations.

## 2015-09-12 NOTE — Consults (Addendum)
Hospitalist Consultation H&P Note    NAME:  Kevin Coffey   DOB:   1980/04/24   MRN:   [id number]     ATTENDING: Dala Dock, MD  Consulting Provider: Dr. Gay Filler  PCP:  None    Date/Time:  09/12/2015 12:21 PM    Subjective:   REQUESTING PHYSICIAN: Dr. Valentina Lucks  REASON FOR CONSULT:  Medical Management    Kevin Coffey is a 35 y.o.  African American male who I was asked to see for medical management. Pt a/o x3. Pt able to answer simple questions and follows commands. 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 Denies SI, HI and hallucinations. Denies SOB, CP, palpitations, N/V/D.    REVIEW OF SYSTEMS:    All other systems negative on 10 system reviewed are normal except for as stated in HPI.    Past Medical History:   Diagnosis Date   ??? Aggressive outburst    ??? Anxiety disorder    ??? Asthma    ??? Depression    ??? Psychiatric disorder     Depression        History reviewed. No pertinent surgical history.      SOCIAL HISTORY:  Social History     Social History   ??? Marital status: MARRIED     Spouse name: N/A   ??? Number of children: N/A   ??? Years of education: N/A     Social History Main Topics   ??? Smoking status: Current Every Day Smoker     Packs/day: 0.50     Years: 20.00     Types: Cigarettes   ??? Smokeless tobacco: Never Used   ??? Alcohol use Yes   ??? Drug use: 1.00 per week     Special: Marijuana   ??? Sexual activity: Yes     Partners: Female     Other Topics Concern   ??? None     Social History Narrative       Family History: Significant for HTN, DM, Cancer- bone, breast, cervical      No Known Allergies     Prior to Admission medications    Medication Sig Start Date End Date Taking? Authorizing Provider   ALPRAZolam Prudy Feeler) 0.5 mg tablet Take 0.5 mg by mouth two (2) times daily as needed for Anxiety.    Phys Other, MD    divalproex DR (DEPAKOTE) 500 mg tablet Take 1 Tab by mouth two (2) times a day. 10/13/13   Reather Littler, MD   QUEtiapine (SEROQUEL) 100 mg tablet Take 1 Tab by mouth two (2) times a day. 10/13/13   Reather Littler, MD           Objective:   VITALS:    Visit Vitals   ??? BP 101/61 (BP Patient Position: At rest)   ??? Pulse 62   ??? Temp 98.4 ??F (36.9 ??C)   ??? Resp 18   ??? Ht  (1.727 m)   ??? Wt 63.5 kg (140 lb)   ??? SpO2 98%   ??? BMI 21.29 kg/m2     Temp (24hrs), Avg:98.4 ??F (36.9 ??C), Min:97.9 ??F (36.6 ??C), Max:99 ??F (37.2 ??C)      PHYSICAL EXAM:      General:   HEENT:  No acute distress. Appears stated age.  Neck  supple. Trachea midline. PEERLA, no icterus.   Lungs:    Clear to auscultation bilaterally. No wheezing/rales/rhonchi. No accessory muscle use. No tachypnea.   Chest wall:   No tenderness or deformity.   Heart:  Regular rate and rhythm, S1, S2 normal, no murmurs, clicks, rubs or gallops. No JVD. No carotid bruits.    Abdomen:   Soft, non-tender/non-distended. Bowel sounds normal. No masses,  No organomegaly. No guarding or rigidity   Extremities: Extremities normal, atraumatic, no cyanosis or edema.   Pulses: 2+ and symmetric all extremities.   Skin: Skin color, texture, turgor normal. No rashes or lesions   Neurologic:     CNII-XII intact. No focal motor or sensory deficits appreciated. AAOx3     LAB DATA REVIEWED:    Recent Labs      09/11/15   1708   WBC  3.5*   HGB  13.7   HCT  38.9   PLT  187     Recent Labs      09/11/15   1708   NA  142   K  4.1   CL  104   CO2  27   GLU  75   BUN  21*   CREA  0.92   CA  8.9   ALB  3.9   SGOT  22   ALT  25     No results for input(s): PH, PCO2, PO2, HCO3, FIO2 in the last 72 hours.    IMAGING RESULTS:  No results found.    REVIEW OF PREVIOUS RECORDS:    Recommendations/Plan:     Assessment     Hospital Problems  Date Reviewed: 09/12/2015          Codes Class Noted POA    * (Principal)Bipolar 1 disorder (HCC) ICD-10-CM: F31.9  ICD-9-CM: 296.7  10/10/2013 Yes         Major depressive disorder ICD-10-CM: F32.9  ICD-9-CM: 296.20  09/12/2015 Yes        Essential hypertension ICD-10-CM: I10  ICD-9-CM: 401.9  09/12/2015 Yes        Alcohol abuse, continuous ICD-10-CM: F10.10  ICD-9-CM: 305.01  09/12/2015 Yes        Mild intermittent asthma without complication ICD-10-CM: J45.20  ICD-9-CM: 493.90  09/12/2015 Yes        Tobacco use ICD-10-CM: Z72.0  ICD-9-CM: 305.1  09/12/2015 Yes        Marijuana use ICD-10-CM: F12.10  ICD-9-CM: 305.20  09/12/2015 Yes               ___________________________________________________  PLAN/RECOMMENDATIONS:    1. All above psych diagnoses to be managed by attending psych team.  2. HTN- pt has history of hypertension, but is well controlled without medication outpatient. F/u vitals.   3. Asthma- no acute exacerbation. PRN duonebs. F/u pulse ox.   4. Alcohol abuse- to be managed by psych team. Pt counseled to stop drinking.   5. Tobacco use- pt counseled to stop smoking. Nicorette gum PRN.   6. Marijuana abuse- to be managed by psych team. Pt counseled to stop marijuana abuse.   7. Pt to follow up outpatient with PCP.      Will sign off at this time. Please call with questions.    Total Time : 35 minutes  ___________________________________________________    Hospitalist: Angela Coxazel L Destyne Goodreau, NP     09/12/2015

## 2015-09-12 NOTE — Progress Notes (Signed)
Problem: Suicide/Homicide (Adult/Pediatric)  Goal: *STG: Remains safe in hospital  Outcome: Progressing Towards Goal  Q 15 minutes checks maintained  Goal: *STG: Seeks staff when feelings of self harm or harm towards others arise  Outcome: Progressing Towards Goal  Pt agrees to seek out any staff if he feels like hurting self, or others  Goal: *STG: Attends activities and groups  Outcome: Progressing Towards Goal  Pt is attending groups and activities  Goal: *STG: Verbalizes alternative ways of dealing with maladaptive feelings/behaviors  Outcome: Not Progressing Towards Goal  Variance: Patient Condition      Goal: *STG/LTG: Complies with medication therapy  Outcome: Progressing Towards Goal  Pt took his medications as ordered  Goal: *STG/LTG: No longer expresses self destructive or suicidal/homicidal thoughts  Outcome: Progressing Towards Goal  Pt is denying SI/HI    Problem: Falls - Risk of  Goal: *Absence of falls  Outcome: Progressing Towards Goal  Pt remains free from falls  Goal: *Knowledge of fall prevention  Outcome: Progressing Towards Goal  Pt made aware of all safety precautions and is wearing grippe sock

## 2015-09-12 NOTE — Other (Signed)
Verbal shift change report given by Constantia H Bryson, RN   (offgoing nurse) [place].  Report given with SBAR, Kardex, MAR and Recent Results.

## 2015-09-12 NOTE — H&P (Signed)
Admission Psychiatry History        Patient:  Kevin Coffey Age:  35 y.o. DOB:  02-Nov-1980     SEX:  male MRN:  [id number] CSN:  [id number]    09/12/2015  1:46 PM    Admit Date:  09/11/2015 Attending:  Deloris Ping, M.D.     Date of Evaluation:  09/12/2015    Subjective:   According to the [facility] ED note,  Kevin Coffey  Is a 35 y.o. male    [place] with h/o anxiety disorder and depression who presents to the ED with complaint of worsening depression and aggression. He states he has been "crying more frequently and throwing stuff in his house." Pt states he was released from prison and ran out of his medication last December. He has been noncompliant with his Depakote and Seroquel prescription since then. Pt denies fever, HA, N/V/D, AH/VH, SI/HI. Pt denies recent EtOH and illicit drug use.   ??  Patient is a 35 y.o. male presenting with mental health disorder. The history is provided by the patient.   Mental Health Problem    This is a new problem. The current episode started more than 1 week ago. The problem has not changed since onset.Associated symptoms include agitation and violence. Pertinent negatives include no weakness and no hallucinations. His past medical history is significant for depression.            _________________________________________________________________    Information from the [facility] ED Care Management Notes:  Pt is a 35y/o AAM who was escorted to the ER by his wife c/o pt with S/I no plan. Pt presents calm, cooperative, alert, OX3 and despairing. Pt states he has been off his meds since Sept. (Depakote, Seroquel & Xanax) due to on/off reasons of going to jail in Nov and moving. Pt states lately he has been having violent episodes where the police have been called to his home for D/V. He states he is depressed and been threatening his wife. Pt states H/I towards his wife and reports having told his wife that yesterday he  thought about how he was going to kill her and get rid of her body. SW requested number to contact pt's wife he states he does not have and that she is at work. SW will call number on face sheet as duty to warn. SW called [phone] and left V/M to wife's recording which had her name on voicemail. Pt states he just doesn't care anymore about life as it's hectic. 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 Pt denies hallucinations or any hx of. Pt denies SA and tox screens are negative but does admit to having used marijuana in the X. Pt reports being seen in the ER yesterday as he states he fell and hurt himself. He reports health conditions of asthma. Pt had no other concerns.   ??  AXIS 1: Bipolar 1  AXIS 2: Deferred  AXIS 3: Asthma, recent fall  AXIS 4: noncompliance with mh tx, problems with primary  AXIS 5: 34  ??  SW consulted with Dr Laurann Montana pt is to be admitted for further evalutaton and stabilization. SW spoke with Elmyra Ricks from [facility]. Pt authorized for 5 days beginning today with a review on Tuesday, May 30th. Auth# [id number].   Kevin Coffey,  MSW Care Management Signed  Progress Notes Date of Service: 09/11/15 2017     Fanny Skates Nurse  Admission Note:   Pt is a 20 male who was admitted form the ED. On arrival to unit, pt was calm, but impatient and irritable. He kept repeating that he was very depressed and that he has not taken his medication since September. Pt also stated, " I have a lot going on, a whole lot. I do not want to talk about it now. 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 Pt kept repeating that he has a lot going on, but will not elaborate. Pt also did not tell Probation officer that he was in prison. Pt just kept  repeating, " They took all the information down stairs. What else do you want to know." However, pt took all his medications. He admitted to feeling of depression, but denies wanting to hurt self, or others. Pt denies any medical history, or drug used except marijuana. Pt admitted to smoking marijuana, but stated that he does not do it often. Tox screen was positive for marijuana. Writer educated pt on the rules and regulations of the unit.Pt is presently resting quietly in bed. Q 15 minutes checks maintained and pt made comfortable.  Marden Noble, RN Registered Nurse Signed NURSING Progress Notes Date of Service: 09/12/15 0110     Past [facility] Kevin Coffey History:  Admit Date: 10/10/2013  Discharge date: 10/13/13  Discharge Diagnostic Impression  ??  Axis I: Bipolar Disorder, Manic Phase. Alcohol Use DO  ??  Axis II: none  Axis III:         Past Medical History   Diagnosis Date   ??? Psychiatric disorder ??   ?? ?? Depression   ??? Aggressive outburst ??   ??? Anxiety disorder ??   ??? Depression ??   ??  Axis IV: social  Axis V: 55  ??  Allergies: No Known Allergies   ??  Discharge medications:   No current facility-administered medications for this encounter.   ??       Current Outpatient Prescriptions   Medication Sig   ??? divalproex DR (DEPAKOTE) 500 mg tablet Take 1 Tab by mouth two (2) times a day.   ??? QUEtiapine (SEROQUEL) 100 mg tablet Take 1 Tab by mouth two (2) times a day.   ??  Follow-up Information    Comments Contact Info   ?? Pt will follow-up @ [facility] [address] [place], [place]. Appointment on October 17, 2013 @ 12:45pm with Dr. Maudie Mercury Pt will return to home @ [address] [place] with Family/ Phone [phone]     Psych History:    Reports his first psychiatric admission was in September of 2015 at [facility] and this is his only other admission.  He denies attending following up appointment on discharge.      Patient Active Problem List    Diagnosis Date Noted    ??? Major depressive disorder 09/12/2015   ??? Essential hypertension 09/12/2015   ??? Alcohol abuse, continuous 09/12/2015   ??? Mild intermittent asthma without complication 41/32/4401   ??? Tobacco use 09/12/2015   ??? Marijuana use 09/12/2015   ??? Bipolar 1 disorder ([place]) 10/10/2013     Psych Medication prior to Admission:    Medication Sig Start Date  End Date   ALPRAZolam (XANAX) 0.5 mg tablet Take 0.5 mg by mouth two (2) times daily as needed for Anxiety.   ????     divalproex DR (DEPAKOTE) 500 mg tablet Take 1 Tab by mouth two (2) times a day. 10/13/13 Admits to not taking any medications since his last [facility] admission in September of 2016   QUEtiapine (SEROQUEL) 100 mg tablet Take 1 Tab by mouth two (2) times a day. 10/13/13 Admits to not taking any medications since his last [facility] admission in September of 2016     Medical History:  Past Medical History:   Diagnosis Date   ??? Aggressive outburst    ??? Anxiety disorder    ??? Asthma    ??? Depression    ??? Psychiatric disorder     Depression     History reviewed. No pertinent surgical history.  No Known Allergies     Social/Education/Work History:    Social History     Social History   ??? Marital status: MARRIED x 7 years after 2 years of dating.  He met his wife in [place] when she was studying to get her Master degree.  His wife works at the same school that as he Nurse, adult.     Spouse name: N/A   ??? Number of children: None of his own but his Wife has 4 children, three girls, ages 61, 65 & 62 and a 110 year old son.  He reports his 30 year old son is entitled and splits him and his wife with his father who he has been living with x 2.5 years.  He states his son's father brought him a new BMW.  He states his daughters stated they are trying to be neutral in the parenting splitting.    ??? 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  Reports loving his job.    Family History:     Significant for HTN, DM, Cancer- bone, breast, cervical   Reports his mother is an alcoholic.  He states his 65 year old brother has been a long term psychiatric resident.    Legal History:   Reports on Probation now.  States at the age of 33 after graduating from [employer] was arrested with a loaded gun and served 96 months of a 10 years sentence.  His 2nd arrest was in March of 2016 for domestic violence.  His 3rd arrest was on October 2016 to December 2016 for destruction of property, i.e. Breaking windows in their home.   He reported having a total of 3 charges for Domestic violence, two of the charges dropped except for the last arrested.    Substance Abuse History:  Social History   Substance Use Topics   ??? Smoking status: Current Every Day Smoker     Packs/day: 1.5 pack eday     Years: 20.00     Types: Cigarettes   ??? Smokeless tobacco: Never Used   ??? Alcohol use Yes   Reports stopped using Marijuana in September of 2016      Objective:   Vital signs:  Blood pressure 101/61, pulse 62, temperature 98.4 ??F (36.9 ??C), resp. rate 18, height _0  (1.727 m), weight 63.5 kg (140 lb), SpO2 98 %.Patient Vitals for the past 8 hrs:  BP Temp Pulse Resp   09/12/15 0830 101/61 98.4 ??F (36.9 ??C) 62 18     Last test results and Reviewed in last 24 hours:  Recent Results (from the past 24 hour(s))   METABOLIC PANEL, COMPREHENSIVE    Collection Time: 09/11/15  5:08 PM   Result Value Ref Range    Sodium 142 136 - 145 mmol/L    Potassium 4.1 3.50 - 5.10 mmol/L    Chloride 104 98 - 107 mmol/L    CO2 27 21 - 32 mmol/L    Anion gap 15 10 - 17 mmol/L    Glucose 75 74 - 106 mg/dL    BUN 21 (H) 7 - 18 MG/DL    Creatinine 0.92 0.6 - 1.3 MG/DL    BUN/Creatinine ratio 23 (H) 6.0 - 20.0      GFR est AA >60 >60 ml/min/1.57m    GFR est non-AA >60 >60 ml/min/1.793m    Calcium 8.9 8.5 - 10.1 MG/DL    Bilirubin, total 0.5 0.20 - 1.00 MG/DL    ALT (SGPT) 25 12.0 - 78.0 U/L    AST (SGOT) 22 15 - 37 U/L    Alk. phosphatase 73 46 - 116 U/L    Protein, total 7.6 6.40 - 8.20 g/dL    Albumin 3.9 3.40 - 5.00 g/dL    Globulin 3.7 g/dL    A-G Ratio 1.1 1.0 - 3.1     CBC WITH AUTOMATED DIFF    Collection Time: 09/11/15  5:08 PM   Result Value Ref Range    WBC 3.5 (L) 4.8 - 10.8 K/uL    RBC 4.94 4.0 - 5.2 M/uL    HGB 13.7 12.8 - 15.0 g/dL    HCT 38.9 36.8 - 45.2 %    MCV 78.7 (L) 81 - 99 FL    MCH 27.7 27 - 31 PG    MCHC 35.2 32 - 36 g/dL    RDW 15.1 (H) 11.5 - 14.5 %    PLATELET 187 140 - 450 K/uL    MPV 9.9 7.4 - 10.4 FL    NEUTROPHILS 42 40 - 70 %    LYMPHOCYTES 51 (H) 16 - 40 %    MONOCYTES 6 0 - 12 %    EOSINOPHILS 1 0 - 5 %    BASOPHILS 0 0 - 2 %    ABS. NEUTROPHILS 1.5 1.4 - 6.5 K/UL    ABS. LYMPHOCYTES 1.8 1.2 - 3.4 K/UL    ABS. MONOCYTES 0.2 (L) 1.1 - 3.2 K/UL    ABS. EOSINOPHILS 0.0 0.0 - 0.7 K/UL    ABS. BASOPHILS 0.0 0.0 - 0.2 K/UL    DF AUTOMATED      NRBC 0.0 0 PER 100 WBC    IMMATURE GRANULOCYTES 0.3 0.0 - 5.0 %   ETHYL ALCOHOL    Collection Time: 09/11/15  5:08 PM   Result Value Ref Range    ALCOHOL(ETHYL),SERUM <3 <3.0 MG/DL   HEMOGLOBIN A1C W/O EAG    Collection Time: 09/12/15  7:00 AM   Result Value Ref Range    Hemoglobin A1c 5.9 4.5 - 6.2 %   LIPID PANEL    Collection Time: 09/12/15  7:00 AM   Result Value Ref Range    LIPID PROFILE          Cholesterol, total 136 <200 MG/DL    Triglyceride 77 0 - 200 MG/DL    HDL Cholesterol 76 (H) 40 - 60 MG/DL  LDL, calculated 44.6 0 - 130 MG/DL    VLDL, calculated 15.4 MG/DL    CHOL/HDL Ratio 1.8      LDL/HDL Ratio 0.6     TSH 3RD GENERATION    Collection Time: 09/12/15  7:00 AM   Result Value Ref Range    TSH 0.82 0.55 - 7.78 uIU/mL          Current Facility-Administered Medications:   ???  nicotine (NICORETTE) gum 4 mg, 4 mg, Oral, Q2H PRN, Kevin L Frey, NP  ???  albuterol-ipratropium (DUO-NEB) 2.5 MG-0.5 MG/3 ML, 3 mL, Nebulization,  Q6H PRN, Kevin L Frey, NP  ???  alum-mag hydroxide-simeth (MYLANTA) oral suspension 30 mL, 30 mL, Oral, TID PRN, Deloris Ping, MD  ???  acetaminophen (TYLENOL) tablet 1,000 mg, 1,000 mg, Oral, Q6H PRN, Deloris Ping, MD  ???  benzocaine-menthol (CEPACOL) lozenge 1 Lozenge, 1 Lozenge, Mucous Membrane, PRN, Deloris Ping, MD  ???  bisacodyl (DULCOLAX) tablet 5 mg, 5 mg, Oral, DAILY PRN, Deloris Ping, MD  ???  chlorhexidine (PERIDEX) 0.12 % mouthwash 10 mL, 10 mL, Oral, BID PRN, Deloris Ping, MD  ???  dicyclomine (BENTYL) capsule 10 mg, 10 mg, Oral, QID PRN, Deloris Ping, MD  ???  magnesium hydroxide (MILK OF MAGNESIA) 400 mg/5 mL oral suspension 30 mL, 30 mL, Oral, QHS PRN, Deloris Ping, MD  ???  ketoconazole (NIZORAL) 2 % cream, , Topical, BID PRN, Deloris Ping, MD  ???  ondansetron (ZOFRAN ODT) tablet 4 mg, 4 mg, Oral, Q6H PRN, Deloris Ping, MD  ???  [START ON 09/13/2015] therapeutic multivitamin (THERAGRAN) tablet 1 Tab, 1 Tab, Oral, DAILY, Deloris Ping, MD  ???  Thiamine Mononitrate (B-1) tablet 100 mg, 100 mg, Oral, NOW, Deloris Ping, MD  ???  white petrolatum (VASELINE) ointment, , Topical, PRN, Deloris Ping, MD  ???  guaiFENesin (ROBITUSSIN) 100 mg/5 mL oral liquid 400 mg, 400 mg, Oral, Q4H PRN, Deloris Ping, MD  ???  [START ON 0/98/1191] folic acid (FOLVITE) tablet 1 mg, 1 mg, Oral, DAILY, Deloris Ping, MD  ???  famotidine (PEPCID) tablet 20 mg, 20 mg, Oral, BID PRN, Deloris Ping, MD  ???  diphenoxylate-atropine (LOMOTIL) tablet 1 Tab, 1 Tab, Oral, QID PRN, Deloris Ping, MD  ???  diphenhydrAMINE (BENADRYL) capsule 100 mg, 100 mg, Oral, QHS PRN, Deloris Ping, MD  ???  LORazepam (ATIVAN) tablet 2 mg, 2 mg, Oral, Q4H PRN, Deloris Ping, MD  ???  OLANZapine (ZyPREXA zydis) disintegrating tablet 5 mg, 5 mg, Oral, Q6H PRN, Deloris Ping, MD  ???  diphenhydrAMINE (BENADRYL) capsule 50 mg, 50 mg, Oral, Q6H PRN, Deloris Ping, MD  ???  QUEtiapine (SEROquel) tablet 200 mg, 200 mg, Oral, TID, Deloris Ping, MD   ???  divalproex DR (DEPAKOTE) tablet 500 mg, 500 mg, Oral, TID, Deloris Ping, MD, 500 mg at 09/12/15 1000  ???  hydrOXYzine pamoate (VISTARIL) capsule 50 mg, 50 mg, Oral, Q6H PRN, Deloris Ping, MD, 50 mg at 09/12/15 1000  ???  cloNIDine HCl (CATAPRES) tablet 0.1 mg, 0.1 mg, Oral, Q4H PRN, Deloris Ping, MD, 0.1 mg at 09/12/15 1000    Physical Exam:   IP Medical Consult Exam:  ??  Date/Time:  09/12/2015 12:21 PM  ??  Subjective:   REQUESTING PHYSICIAN: Dr. Laurann Montana    REASON FOR CONSULT: Medical Management   Kevin Coffey is a 35 y.o. African American male who I was asked to see for medical management. Pt a/o x3. Pt able to answer simple questions and follows commands. 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 Pt begins to raise his voice when he discusses the psych unit and feels it is "disorganized" and he feels like he has no control. Denies SI, HI and hallucinations. Denies SOB, CP, palpitations, N/V/D.  ??  REVIEW OF SYSTEMS:   All other systems negative on 10 system reviewed are normal except for as stated in HPI.  ??       Past Medical History:   Diagnosis Date   ??? Aggressive outburst ??   ??? Anxiety disorder ??   ??? Asthma ??   ??? Depression ??   ??? Psychiatric disorder ??   ?? Depression      ??  History reviewed. No pertinent surgical history.  ??  ??  SOCIAL HISTORY:   Social History       ??        Social History   ??? Marital status: MARRIED   ?? ?? Spouse name: N/A   ??? Number of children: N/A   ??? Years of education: N/A   ??         Social History Main Topics   ??? Smoking status: Current Every Day Smoker   ?? ?? Packs/day: 0.50   ?? ?? Years: 20.00   ?? ?? Types: Cigarettes   ??? Smokeless tobacco: Never Used   ??? Alcohol use Yes    ??? Drug use: 1.00 per week   ?? ?? Special: Marijuana   ??? Sexual activity: Yes   ?? ?? Partners: Female   ??       Other Topics Concern   ??? None   ??     ??  ??  Family History: Significant for HTN, DM, Cancer- bone, breast, cervical    ??  No Known Allergies   ??           Prior to Admission medications    Medication Sig Start Date End Date Taking? Authorizing Provider   ALPRAZolam Duanne Moron) 0.5 mg tablet Take 0.5 mg by mouth two (2) times daily as needed for Anxiety. ?? ?? ?? Phys Other, MD   divalproex DR (DEPAKOTE) 500 mg tablet Take 1 Tab by mouth two (2) times a day. 10/13/13 ?? ?? Nell Range, MD   QUEtiapine (SEROQUEL) 100 mg tablet Take 1 Tab by mouth two (2) times a day. 10/13/13 ?? ?? Nell Range, MD   ??  ??  ??  ??  Objective:   VITALS:        Visit Vitals   ??? BP 101/61 (BP Patient Position: At rest)   ??? Pulse 62   ??? Temp 98.4 ??F (36.9 ??C)   ??? Resp 18   ??? Ht _0  (1.727 m)   ??? Wt 63.5 kg (140 lb)   ??? SpO2 98%   ??? BMI 21.29 kg/m2   ??  Temp (24hrs), Avg:98.4 ??F (36.9 ??C), Min:97.9 ??F (36.6 ??C), Max:99 ??F (37.2 ??C)  ??  ??  PHYSICAL EXAM:   ????  General:   HEENT:  No acute distress. Appears stated age.  Neck supple. Trachea midline. PEERLA, no icterus.   Lungs:  ???? Clear to auscultation bilaterally. No wheezing/rales/rhonchi. No accessory muscle use. No tachypnea.   Chest wall:  No tenderness or deformity.   Heart:  Regular rate and rhythm, S1, S2 normal, no murmurs, clicks, rubs or gallops. No JVD. No carotid bruits.    Abdomen:  Soft, non-tender/non-distended. Bowel sounds normal. No masses,  No organomegaly. No guarding or rigidity   Extremities: Extremities normal, atraumatic, no cyanosis or edema.   Pulses: 2+ and symmetric all extremities.   Skin: Skin color, texture, turgor normal. No rashes or lesions   Neurologic:  ?? CNII-XII intact. No focal motor or sensory deficits appreciated. AAOx3   ??  LAB DATA REVIEWED:       Recent Labs   ???? 09/11/15  1708   WBC 3.5*   HGB 13.7   HCT 38.9   PLT 187   ??      Recent Labs   ???? 09/11/15  1708   NA 142   K 4.1   CL 104   CO2 27   GLU 75   BUN 21*   CREA 0.92   CA 8.9   ALB 3.9   SGOT 22   ALT 25   ??  No results for input(s): PH, PCO2, PO2, HCO3, FIO2 in the last 72 hours.  ??  IMAGING RESULTS:  No results found.  ??  REVIEW OF PREVIOUS RECORDS:  ??   Recommendations/Plan:   ??  Assessment  ????  Hospital Problems  Date Reviewed: 09-20-2015    ???? ???? ???? Codes Class Noted POA   ?? * (Principal)Bipolar 1 disorder ([place]) ICD-10-CM: F31.9  ICD-9-CM: 296.7 ?? 10/10/2013 Yes   ?? ??   ?? Major depressive disorder ICD-10-CM: F32.9  ICD-9-CM: 296.20 ?? 09-20-2015 Yes   ?? ??   ?? Essential hypertension ICD-10-CM: I10  ICD-9-CM: 401.9 ?? 20-Sep-2015 Yes   ?? ??   ?? Alcohol abuse, continuous ICD-10-CM: F10.10  ICD-9-CM: 305.01 ?? 09/20/15 Yes   ?? ??   ?? Mild intermittent asthma without complication FTD-32-KG: U54.27  ICD-9-CM: 493.90 ?? September 20, 2015 Yes   ?? ??   ?? Tobacco use ICD-10-CM: Z72.0  ICD-9-CM: 305.1 ?? 09/20/15 Yes   ?? ??   ?? Marijuana use ICD-10-CM: F12.10  ICD-9-CM: 305.20 ?? 2015-09-20 Yes   ?? ??      ??   ??  ___________________________________________________  PLAN/RECOMMENDATIONS:   1. All above psych diagnoses to be managed by attending psych team.  2. HTN- pt has history of hypertension, but is well controlled without medication outpatient. F/u vitals.   3. Asthma- no acute exacerbation. PRN duonebs. F/u pulse ox.   4. Alcohol abuse- to be managed by psych team. Pt counseled to stop drinking.   5. Tobacco use- pt counseled to stop smoking. Nicorette gum PRN.   6. Marijuana abuse- to be managed by psych team. Pt counseled to stop marijuana abuse.   7. Pt to follow up outpatient with PCP.  ??  ??  Will sign off at this time. Please call with questions.  ??  Total Time : 35 minutes     Hospitalist: Trixie Dredge, NP - 09-20-2015           _________________________________________________________________    F[facility] Sk[facility] Psychiatric Admission Evaluation:  I met with Kevin Coffey today and asked what is his understanding for the reason for his admission to the hospital.  Ms. Kevin Coffey stated that "I needed to get back on my medicine. 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  My wife gave me an ultimatum to coming into the hospital and get back on my medicine if I loved her and I decided to come  to the ED.  When they asked if I had thoughts of doing self-harm I stated I did but I never said I had any plans or intentions because I love my wife and children and I will not kill myself.  I did not state I was homicidal or hearing voices.  I just want to get back on my medicines and go home and back to work ASAP."   Mr. Kataoka  denies having auditory hallucinations and have not display symptoms or signs of responding to internal stimuli or self-talking.  He denies feelings of hopelessness, helplessness, or worthlessness and denies having any suicidal, homicidal, or paranoid ideations.  He admits to having racing thoughts, irritable and angry mood but denies having any thoughts or intentions of doing self-harm or harm to anyone.    Mental Status Exam On Admission:  Appearance and  General Behavior  General appearance:    Gait  Connection with Provider  Clothing:                 Eye Contact  Attention Span  Psychomotor Activity  Abnormal Movements    WDWN and well kept  Stable  Good    hospital   WNL  Good       None   None   Speech form and content,  Language/Associations  Speech rate:  Speech rhythm:  Volume:  Tone/Prosody:  Dysarthria:          Form of Thought  Disorder:  Pressured Speech:     Thought process:   Normal & Spontaneous   Normal  Normal  Curt & Tense sometimes  None    None  Clear, Coherent, Goal Directed, future oriented, Organized, No distortions, No Flight of Ideas &  informative and logical, no circumstantiality or tangential thinnking   Stated mood:  Objectively appears:   Affect  Affect is:   Facial Expression:    Vital Sense (Physical well-being):       Self-Attitude            SI/HI/PDW    Hopefulness for the future:  Passive death wish:  Thoughts/Intent/Plan to harm self:   Thoughts/Intent/Plan to harm others: Alright but regretful for stopping medications  appropriate to circumstances and Concerns  Constricted  Congruent with stated Mood      Irritable Expressive gestures     Good  Good    Good  denied   No intention and strongly denies  No  intention and strongly denies   Abnormal Perceptions: Illusions / Hallucinations:  denied &  Not responding to any internal stimuli    Delusions   None       Anxiety   Obsessions Behavior: None; Obsessive Thoughts: None;    Complaints of Anxiety: None   Alertness  and  Orientation Alert and Oriented to person, time, and place                                              Judgment & Insight   Situation testing: Good; Judgment: Fair  ; Insight:Good       Impression:    Axis I:     Patient Active Problem List   Diagnosis Code   ??? Essential hypertension I10   ??? Mild intermittent asthma without complication Q76.19   ??? Cigarette nicotine dependence with withdrawal ([facility]) F17.213   ??? Bipolar affective  disorder, current episode severe ([place]) F31.9   ??? Marital relationship problem Z63.0     Axis II:  Deferred    Axis III:    Past Medical History:   Diagnosis Date   ??? Aggressive outburst    ??? Anxiety disorder    ??? Asthma    ??? Depression    ??? Psychiatric disorder     Depression           No Known Allergies    Axis IV:  Acute: Problems with primary support group       Problems related to social environment,       Other psychosocial or environmental problems       Problems with Legal System      Chronic Psychiatric Problems      Chronic Noncompliance with Psychiatric Treatment         Axis V: Current -   21-30; Past Year: 41-50         Plan:     Competency Statement:   At the current time, the patient is competent to make informed consent regarding their current medical care and discharge planning and/or financial decisions.    Recommendations for Treatment/Conditions:  Psychiatric Inpatient treatment on [facility] is recommended while in hospital   Outpatient follow up recommended after release     THERAPEUTIC GOALS AND PLANS:    THERAPEUTIC GOALS:  1. Kevin Coffey  with actively participate in discharge planning.   2. Kevin Coffey   will be compliant in taking all prescribed psychiatric medications  3. Kevin Coffey   will have a decrease intensity of his Depressed & Suicidal Symptoms,  i.e., self-isolations, depress/sad mood, responding to internal stimuli, inappropriate behavior, poor and intrusive boundaries, agitation, aggression, paranoia.  4. Kevin Coffey   will attend and participate in Community/milieu activities.   5. Kevin Coffey   will be visible and interact with peers and staff appropriately in the milieu.    PLANS:    Continue inpatient psychiatric hospitalization on level: Routine precautions  1. Continue current medications,discussed medication risk benefit profile and potential side effects.  2. Medication reconciliation completed with the patient and within the  EMR.   3. Medication education, i.e. Discussion of medication risk and benefits;  Patient was informed of the following medication risk and side effects, abnormal and painful muscle tightness and spasm, involuntary muscle movements, e.g. lip smacking, tongue and hand trembling, sedation, dryness of eyes, mouth, and nose, difficulty in passing urine and or stool (constipation), problems of ejaculation, orgasm, and erection, seizures, excessive or decrease in appetite, weight, or sexual interest, risk of heart disease, strokes, hypertension, and hypercholesterolemia that may result in early death if labs and medication are not monitored regularly, i.e. monthly or every 3 months, a chance of  emotional instability, e.g. mania, depression, suicidal thoughts or behavior, and risk of priapism, i.e. prolong erection of the penis or clitoris that might result in permanent damage or nonfunctioning.    4. Diet:    Regular    5. Supportive psychotherapy.    6. Smoking:   Mr. MACEO HERNAN was informed that it is very important that he quit smoking cigarettes and that there are various alternatives available to help with this difficult task, but first and foremost, he must make a firm commitment and decision to quit. The nature of marijuana addiction was discussed. The usefulness of behavioral therapy was discussed and suggested.  Discussion of what purpose smoking Nicotine use serves and whether certain medications for anxiety and or depression, e.g., Bupropion, Lexapro, Effexor, Prozac, Zoloft, etc., and there cost (sometimes not covered fully by insurance) and side effects were reviewed.  I recommend that he not allowing the potential costs of treatment to deter him from using psychiatric therapy and or medication, because the long term economic and health benefits are obvious.  7. Insight oriented therapy  8. Group attendance/processing  9. Relapse prevention  10. Somatic Management   11. continue to build rapport.   12. Consultations Requested:  IP Medical for medical management -Routine  13. Medication changes/Orders as follows:   Restart Depakote at 500 mg BID & Seroquel 100 mg qhs to treat mood instability  14. Lab Orders:   Serum Ammonia, Magnesium, TSH, Lipid Profile, Hemoglobin A1C, RPR,  Vitamins B12 & D levels  15. Appropriate disposition not finalized   16. Disposition planning   17. Continue discharge planning  - possibly on Monday to home if stable  18. Discharge Recommendations:   Family and Marital Therapy, [place] with possible follow up at [place] or [place]    ???I have considered all of the patient-specific risk factors that increase (e.g. race, gender, family history, personal history of attempts, access to lethal means) and mitigate (e.g. strong family supports, positive coping skills, demographic features) suicide risk in this patient in  formulating the patient???s current risk profile. This assessment will be revised based on Mr. Treyshawn A Westergard???s clinical condition and any new personal or environmental variables that are discovered or arise.??? Mr. Kevin RICKMAN is not endorsing suicidal ideation.    I certify that this patient's inpatient psychiatric hospital services that were furnished since the previous certification were, and continue to be, required for treatment that could reasonably be expected to improve the patient's condition, and the need for diagnostic studies, medication adjustments, and the daily supervision, monitoring for abnormal psychological and social behavior of the patient that are indications of self-harm or harm of others are necessary in a highly structured inpatient psychiatric facility at this time.  In addition, the hospital records will show that services furnished were required intensive treatment services related to the patient???s admission and or related services, or equivalent services. The services that provided are necessary until the Treatment Team, that consist of the patient's psychiatrist  determined that the patient is no longer a self-threat or a threat to others and do not require inpatient psychiatric treatment and can be safely return to their previous living residence for mental health follow-up in the community.    Deloris Ping, MD   09/12/2015 1:46 PM

## 2015-09-12 NOTE — Progress Notes (Signed)
Pt is a [age]-year-old male who was admitted form the ED. On arrival to unit, pt was calm, but impatient and irritable. He kept repeating that he was very depressed and that he has not taken his medication since September. Pt also stated, " I have a lot going on, a whole lot. I do not want to talk about it now. 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 Pt kept repeating that he has a lot going on, but will not elaborate. Pt also did not tell Clinical research associatewriter that he was in prison. Pt just kept repeating, " They took all the information down stairs. What else do you want to know." However, pt took all his medications. He admitted to feeling of depression, but denies wanting to hurt self, or others. Pt denies any medical history, or drug used except marijuana. Pt admitted to smoking marijuana, but stated that he does not do it often. Tox screen was positive for marijuana.  Writer educated pt on the rules and regulations of the unit.Pt is presently resting quietly in bed. Q 15 minutes checks maintained and pt made comfortable.

## 2015-09-12 NOTE — Progress Notes (Addendum)
09/12/15 1400   Attitude and Behavior   General Attitude Apathetic   Affect Flat   Mood Labile   Insight Poor   Judgement Impaired   Memory  Intact   Thought Content Ambivalence   Hallucinations None   Delusions Paranoid   Concentration Fair   Speech Pattern Word salad   Thought Process Looseness of association   Motor Activity Unremarkable   Pt was visible in the milieu interacting with peers appropriately.   The patient's affect is labile His mood is irritable. Pt states he is here to get stable, and back on his medication. pt states that he dopes not take seroquel during the day because it makes him too drowsy. The patient  Was encouraged to discuss medication with his psychiatrist,.

## 2015-09-13 LAB — LIPID PANEL
CHOL/HDL Ratio: 2
Cholesterol, total: 139 MG/DL (ref <200)
HDL Cholesterol: 71 MG/DL — ABNORMAL HIGH (ref 40–60)
LDL, calculated: 51.2 MG/DL (ref 0–130)
LDL/HDL Ratio: 0.7
Triglyceride: 84 MG/DL (ref 0–200)
VLDL, calculated: 16.8 MG/DL

## 2015-09-13 LAB — RPR
RPR: NONREACTIVE
RPR: NONREACTIVE

## 2015-09-13 LAB — VITAMIN B12: Vitamin B12: 442 pg/mL (ref 211–911)

## 2015-09-13 LAB — AMMONIA: Ammonia, plasma: 59 umol/L — ABNORMAL HIGH (ref 11.0–32.0)

## 2015-09-13 LAB — HEMOGLOBIN A1C WITH EAG: Hemoglobin A1c: 5.9 % (ref 4.5–6.2)

## 2015-09-13 MED ORDER — LACTULOSE 10 GRAM/15 ML ORAL SOLUTION
10 gram/15 mL | Freq: Every evening | ORAL | Status: DC
Start: 2015-09-13 — End: 2015-09-15
  Administered 2015-09-14 (×2): via ORAL

## 2015-09-13 MED FILL — FOLIC ACID 1 MG TAB: 1 mg | ORAL | Qty: 1

## 2015-09-13 MED FILL — QUETIAPINE 200 MG TAB: 200 mg | ORAL | Qty: 1

## 2015-09-13 MED FILL — LORAZEPAM 2 MG TAB: 2 mg | ORAL | Qty: 1

## 2015-09-13 MED FILL — DIVALPROEX 500 MG TAB, DELAYED RELEASE: 500 mg | ORAL | Qty: 1

## 2015-09-13 MED FILL — DIPHENHYDRAMINE 50 MG CAP: 50 mg | ORAL | Qty: 2

## 2015-09-13 MED FILL — THIAMINE MONONITRATE 100 MG TABLET: 100 mg | ORAL | Qty: 1

## 2015-09-13 MED FILL — THERA 400 MCG TABLET: 400 mcg | ORAL | Qty: 1

## 2015-09-13 MED FILL — CLONIDINE 0.1 MG TAB: 0.1 mg | ORAL | Qty: 1

## 2015-09-13 NOTE — Progress Notes (Signed)
09/12/15 2037   Attitude and Behavior   General Attitude Cooperative   Affect Blunted   Mood Anxious   Insight Fair   Judgement Impaired   Memory  Intact   Thought Content Unremarkable   Hallucinations None   Delusions Paranoid   Concentration Fair   Speech Pattern Unremarkable   Thought Process Unremarkable   Motor Activity Freedom of movement     Pt was in the day room most of the evening, he was watching  The shows on TV, he was woried because his roommate was acting strangely and he requested for a room change , he was transferred to room 424 A. He took all his medications , denies suicidal/homicidal thoughts, AH/VH. He interacted appropriately with peers.He has been sleeping peacefully since he went to bed , Q 15 minutes was maintained all night.

## 2015-09-13 NOTE — Other (Signed)
Recorded shift change report given by FELICIA C FAUNTLEROY, RN   (offgoing nurse).  Report given with SBAR.

## 2015-09-13 NOTE — Progress Notes (Signed)
Problem: Suicide/Homicide (Adult/Pediatric)  Goal: *STG: Remains safe in hospital  Outcome: Progressing Towards Goal  Pt will remain safe in the hospital, he denies suicidal/homicidal thoughts  Goal: *STG/LTG: Complies with medication therapy  Outcome: Progressing Towards Goal  He is compliant with his medication    Problem: Falls - Risk of  Goal: *Absence of falls  Outcome: Progressing Towards Goal  He had no falls, he is alert and oriented  Goal: *Knowledge of fall prevention  Outcome: Progressing Towards Goal  He was educated on fall prevention

## 2015-09-13 NOTE — Progress Notes (Signed)
09/13/15 0825   Attitude and Behavior   General Attitude Attentive;Cooperative   Affect Blunted   Mood Anxious   Insight Fair   Judgement Impaired   Memory  Intact   Thought Content Unremarkable   Hallucinations None   Delusions Paranoid   Concentration Fair   Speech Pattern Unremarkable   Thought Process Unremarkable   Motor Activity Unremarkable        09/13/15 0825   Attitude and Behavior   General Attitude Attentive;Cooperative   Affect Blunted   Mood Anxious   Insight Fair   Judgement Impaired   Memory  Intact   Thought Content Unremarkable   Hallucinations None   Delusions Paranoid   Concentration Fair   Speech Pattern Unremarkable   Thought Process Unremarkable   Motor Activity Unremarkable   Pt has been viisble on th unit for breakfast and medication. Pts general attitude is attentive and cooperative on approach. Pts affect is blunted anxious mood. Pt is preoccupied with discharge. Pt  Is preoccupied with discharge but guarded on issues . During a visit with wife pt was animated using a lot of hand gestures . Pt was heard saying," that visit set me back"and wife was tearful. Pt was also observed playing cards and interacting appropropriately with selective peers and staff. Denies SI and HI.Denies AH and VH.Q 15 minute observation maintained.

## 2015-09-13 NOTE — Progress Notes (Signed)
09/13/15 2152   Attitude and Behavior   General Attitude Distracted;Guarded   Affect Blunted   Mood Anxious   Insight Fair   Judgement Impaired   Memory  Intact   Thought Content Unremarkable   Hallucinations None   Delusions Paranoid   Concentration Fair   Speech Pattern Unremarkable   Thought Process Unremarkable   Motor Activity Freedom of movement     Pt was in his room most of the evening complaining about feeling depressed, he was encouraged to come out to the milieu and interact with peers, he obliged, told the writer he felt a lot better after his stay , he accepted his medication, denies suicidal/homicidal thoughts, AH/VH. He took a shower and retired to bed thereafter,he did not exhibit any behavioral problems; will continue to monitor every 15 minutes for safety.

## 2015-09-13 NOTE — Progress Notes (Signed)
Recorded shift change report given by FELICIA C FAUNTLEROY, RN   (offgoing nurse).  Report given with SBAR.

## 2015-09-13 NOTE — Progress Notes (Signed)
Physician Daily Progress Note      Patient:  Kevin Coffey Age:  35 y.o. DOB:  1981-01-12     SEX:  male MRN:  [id number] CSN:  [id number]    Admit Date:  09/11/2015 Attending:  Deloris Ping, MD  09/13/2015       6:45 PM    Subjective:    I met with Kevin Coffey today and he reports feeling okay and continues to denies having auditory hallucinations and has not displayed any symptoms or signs of responding to internal stimuli or self-talking since his admission.   He He continues to denies feelings of hopelessness, helplessness, or worthlessness and denies having any suicidal, homicidal, or paranoid ideations.  He denies having any medications side effects.    Note:  Patient resist having urine tox screen done    Current Medications:    Current Facility-Administered Medications   Medication Dose Route Frequency Provider Last Rate Last Dose   ??? nicotine (NICORETTE) gum 4 mg  4 mg Oral Q2H PRN Razel L Frey, NP       ??? albuterol-ipratropium (DUO-NEB) 2.5 MG-0.5 MG/3 ML  3 mL Nebulization Q6H PRN Razel L Frey, NP       ??? alum-mag hydroxide-simeth (MYLANTA) oral suspension 30 mL  30 mL Oral TID PRN Deloris Ping, MD       ??? acetaminophen (TYLENOL) tablet 1,000 mg  1,000 mg Oral Q6H PRN Deloris Ping, MD       ??? benzocaine-menthol (CEPACOL) lozenge 1 Lozenge  1 Lozenge Mucous Membrane PRN Deloris Ping, MD       ??? bisacodyl (DULCOLAX) tablet 5 mg  5 mg Oral DAILY PRN Deloris Ping, MD       ??? chlorhexidine (PERIDEX) 0.12 % mouthwash 10 mL  10 mL Oral BID PRN Deloris Ping, MD       ??? dicyclomine (BENTYL) capsule 10 mg  10 mg Oral QID PRN Deloris Ping, MD       ??? magnesium hydroxide (MILK OF MAGNESIA) 400 mg/5 mL oral suspension 30 mL  30 mL Oral QHS PRN Deloris Ping, MD       ??? ketoconazole (NIZORAL) 2 % cream   Topical BID PRN Deloris Ping, MD       ??? ondansetron (ZOFRAN ODT) tablet 4 mg  4 mg Oral Q6H PRN Deloris Ping, MD       ??? therapeutic multivitamin [facility]) tablet 1 Tab  1 Tab Oral DAILY  Deloris Ping, MD   1 Tab at 09/13/15 0912   ??? Thiamine Mononitrate (B-1) tablet 100 mg  100 mg Oral NOW Deloris Ping, MD   100 mg at 09/12/15 1400   ??? white petrolatum (VASELINE) ointment   Topical PRN Deloris Ping, MD       ??? guaiFENesin (ROBITUSSIN) 100 mg/5 mL oral liquid 400 mg  400 mg Oral Q4H PRN Deloris Ping, MD       ??? folic acid (FOLVITE) tablet 1 mg  1 mg Oral DAILY Deloris Ping, MD   1 mg at 09/13/15 0911   ??? famotidine (PEPCID) tablet 20 mg  20 mg Oral BID PRN Deloris Ping, MD       ??? diphenoxylate-atropine (LOMOTIL) tablet 1 Tab  1 Tab Oral QID PRN Deloris Ping, MD       ??? diphenhydrAMINE (BENADRYL) capsule 100 mg  100 mg Oral QHS PRN Deloris Ping, MD   100 mg at 09/12/15 2228   ??? LORazepam (ATIVAN) tablet 2 mg  2  mg Oral Q4H PRN Deloris Ping, MD   2 mg at 09/13/15 0912   ??? OLANZapine (ZyPREXA zydis) disintegrating tablet 5 mg  5 mg Oral Q6H PRN Deloris Ping, MD       ??? diphenhydrAMINE (BENADRYL) capsule 50 mg  50 mg Oral Q6H PRN Deloris Ping, MD       ??? divalproex DR (DEPAKOTE) tablet 500 mg  500 mg Oral Q12H Deloris Ping, MD   500 mg at 09/13/15 0109   ??? QUEtiapine (SEROquel) tablet 200 mg  200 mg Oral QHS Deloris Ping, MD   200 mg at 09/12/15 2228   ??? hydrOXYzine pamoate (VISTARIL) capsule 50 mg  50 mg Oral Q6H PRN Deloris Ping, MD   50 mg at 09/12/15 1000   ??? cloNIDine HCl (CATAPRES) tablet 0.1 mg  0.1 mg Oral Q4H PRN Deloris Ping, MD   0.1 mg at 09/13/15 1613       Compliant with medication:  Yes    Side effects from medications:  No    Last test results and Reviewed in last 24 hours:  Recent Results (from the past 24 hour(s))   AMMONIA    Collection Time: 09/13/15  5:40 AM   Result Value Ref Range    Ammonia 59 (H) 11.0 - 32.0 UMOL/L   VITAMIN B12    Collection Time: 09/13/15  6:00 AM   Result Value Ref Range    Vitamin B12 442 211 - 911 pg/mL   LIPID PANEL    Collection Time: 09/13/15  6:00 AM   Result Value Ref Range    LIPID PROFILE           Cholesterol, total 139 <200 MG/DL    Triglyceride 84 0 - 200 MG/DL    HDL Cholesterol 71 (H) 40 - 60 MG/DL    LDL, calculated 51.2 0 - 130 MG/DL    VLDL, calculated 16.8 MG/DL    CHOL/HDL Ratio 2.0      LDL/HDL Ratio 0.7     HEMOGLOBIN A1C WITH EAG    Collection Time: 09/13/15  6:00 AM   Result Value Ref Range    Hemoglobin A1c 5.9 4.5 - 6.2 %   RPR    Collection Time: 09/13/15  6:00 AM   Result Value Ref Range    RPR NONREACTIVE NR       Mental Status Exam:    Appearance & General Behavior  Gait  Connection with Provider  Clothing:                      Eye Contact  Attention Span  Psychomotor Activity  Abnormal Movements   WDWN and Clean & Neat   Stable  Fair    Personal  Fair & Steady with blinking WNL              Good  None      None   Speech form/content, Language Associations  Speech rate:       Speech rhythm:  Volume:  Tone/Prosody:  Inflections   Dysarthria:  Language Barrier:                          Formal Thought Disorder:  Pressured speech:  Form of Thought process     Normal & Spontaneous   normal   Normal                     Normal  Present     None       None    None       Still Distortions and exaggerations of events but appear more manipulative than formal thought disorder, Clear, Coherent, Organized, Informative, goal directed, and future oriented    Stated mood:                                              Objectively appears:   Affect  Affect is:   Facial Expression:   Vital Sense (Physical well-being):   Self-Attitude                               SI/HI/PDW  Hopefulness for the future:  Passive death wish:  Thoughts/Intent/Plan to harm self:                           Okay   appropriate to circumstances and Concerns  Constricted  Fairly Congruent with stated Mood         ConstrictGood  Good    Good       No intention      No intention     Abnormal Perceptions: illusions/Hallucinations:  denied  and Mental Status does not support responding to internal stimuli          Delusions   None   Anxiety   Obsessions Behavior: None; Obsessive Thoughts: None;   Complaints of Anxiety: None   Alertness and Orientation Alert and Oriented to person, time, and place:                                              Judgment & Insight   Situation testing: Poor; Judgment: Fair;  Insight: Fair     Axis 1:    Patient Active Problem List   Diagnosis Code   ??? Essential hypertension I10   ??? Mild intermittent asthma without complication O75.64   ??? Cigarette nicotine dependence with withdrawal ([place]) F17.213   ??? Marital relationship problem Z63.0   ??? Mood disorder (HCC) F39   ??? Antisocial personality disorder in adult F60.2     Competency Statement:   At the current time, the patient is competent to make informed consent regarding their current medical care and discharge planning and/or financial decisions.    Recommendations/Plan/Therapeutic Goals:    THERAPEUTIC GOALS:  1. Kevin Coffey with actively participate in discharge planning.   2. Kevin Coffey will be compliant in taking all prescribed psychiatric medications  3. Kevin Coffey will have a decrease intensity of his Depression & Suicidal symptoms, i.e., self-isolations, depress/sad mood, feelings of hopelessness, helplessness, worthlessness, suicidal ideation.  4. Kevin Coffey  will attend and participate in Community/milieu activities.   5. Kevin Coffey will be visible and interact with peers and staff appropriately in the milieu.    PLAN  Continue inpatient psychiatric hospitalization on level: Routine precautions  1. Continue current medications,discussed medication risk benefit profile and potential side effects.  2. Medication reconciliation completed with the patient and within the  EMR.   3. Medication education, i.e. Discussion of medication risk  and benefits;  Patient was informed of the following medication risk and side effects,  abnormal and painful muscle tightness and spasm, involuntary muscle movements, e.g. lip smacking, tongue and hand trembling, sedation, dryness of eyes, mouth, and nose, difficulty in passing urine and or stool (constipation), problems of ejaculation, orgasm, and erection, seizures, excessive or decrease in appetite, weight, or sexual interest, risk of heart disease, strokes, hypertension, and hypercholesterolemia that may result in early death if labs and medication are not monitored regularly, i.e. monthly or every 3 months, a chance of  emotional instability, e.g. mania, depression, suicidal thoughts or behavior, and risk of priapism, i.e. prolong erection of the penis or clitoris that might result in permanent damage or nonfunctioning.    4. Supportive psychotherapy.    5. Response to medications is showing improvements   6. Insight oriented therapy  7. Group attendance/processing    8. Relapse prevention  9. Somatic Management   10. continue to build rapport.   11. Medication changes/Orders as follows:   None    12. Medication Reason: Stabilize mood  13. Lab Orders:       None- Note:  Patient resist having urine tox screen done  14. Appropriate disposition not finalized   15. Disposition planning    16. Continue discharge planning -discharge home tomorrow if stable  17. Discharge Recommendations:  Out patient Mental Health Services,     ???I have considered all of the patient-specific risk factors that increase (e.g. race, gender, family history, personal history of attempts, access to lethal means) and mitigate (e.g. strong family supports, positive coping skills, demographic features) suicide risk in this patient in formulating the patient???s current risk profile. This assessment will be revised based on Kevin Coffey???s clinical condition and any new personal or environmental variables that are discovered or arise.??? Kevin Coffey is not endorsing suicidal ideation.     I certify that this patient's inpatient psychiatric hospital services furnished since the previous certification were, and continue to be, required for treatment that could reasonably be expected to improve the patient's condition, or for diagnostic study, and that the patient continues to need, on a daily basis, active treatment furnished directly by or requiring the supervision of inpatient psychiatric facility personnel.  In addition the hospital records show that services furnished were intensive treatment services, admission or related services, or equivalent services. These services are necessary until the day of the patient's discharge in which the Treatment Team that consist of the patient's psychiatrist had determined that the patient no longer requires inpatient psychiatric treatment and is no longer a self-threat or a threat to others and can be safely return to their previous living residence for mental health follow-up in the community.    Deloris Ping, MD   09/13/2015 6:45 PM

## 2015-09-13 NOTE — Progress Notes (Addendum)
Verbal shift change report given by Jarrett Sohoimothy Udoji, RN   (offgoing nurse).  Report given with SBAR, Kardex, MAR and Recent Results.         Blood draw completed this AM

## 2015-09-13 NOTE — Progress Notes (Signed)
Problem: Suicide/Homicide (Adult/Pediatric)  Goal: *STG: Remains safe in hospital  Outcome: Progressing Towards Goal  Pt will remain safe in the hospital, he denies suicidal/homicidal thoughts  Goal: *STG: Attends activities and groups  Outcome: Progressing Towards Goal  He attends activities   Goal: *STG/LTG: Complies with medication therapy  Outcome: Progressing Towards Goal  He is compliant with his medication therapy

## 2015-09-13 NOTE — Progress Notes (Signed)
Pt was given clothes by this Clinical research associatewriter. Any items that are not allowed on the unit was placed in pts locker in room  411 locker 424 A

## 2015-09-13 NOTE — Progress Notes (Signed)
Ammonia 59 . Dr.Griffin made aware Lactulose ordered. Oncoming  nurse made aware.e

## 2015-09-14 MED ORDER — THIAMINE MONONITRATE 100 MG TABLET
100 mg | Freq: Every day | ORAL | Status: DC
Start: 2015-09-14 — End: 2015-09-15
  Administered 2015-09-15: 14:00:00 via ORAL

## 2015-09-14 MED FILL — LACTULOSE 10 GRAM/15 ML (15 ML) ORAL SOLUTION: 10 gram/15 mL (15 mL) | ORAL | Qty: 15

## 2015-09-14 MED FILL — DIVALPROEX 500 MG TAB, DELAYED RELEASE: 500 mg | ORAL | Qty: 1

## 2015-09-14 MED FILL — QUETIAPINE 200 MG TAB: 200 mg | ORAL | Qty: 1

## 2015-09-14 MED FILL — DIPHENHYDRAMINE 50 MG CAP: 50 mg | ORAL | Qty: 2

## 2015-09-14 MED FILL — LORAZEPAM 2 MG TAB: 2 mg | ORAL | Qty: 1

## 2015-09-14 MED FILL — NICOTINE (POLACRILEX) 2 MG GUM: 2 mg | BUCCAL | Qty: 2

## 2015-09-14 MED FILL — FOLIC ACID 1 MG TAB: 1 mg | ORAL | Qty: 1

## 2015-09-14 MED FILL — THERA 400 MCG TABLET: 400 mcg | ORAL | Qty: 1

## 2015-09-14 NOTE — Other (Signed)
Recorded shift change report given by FELICIA C FAUNTLEROY, RN   (offgoing nurse).  Report given with SBAR.

## 2015-09-14 NOTE — Progress Notes (Signed)
Problem: Suicide/Homicide (Adult/Pediatric)  Goal: *STG: Remains safe in hospital  Outcome: Progressing Towards Goal  Pt remained safe this shift  Goal: *STG: Seeks staff when feelings of self harm or harm towards others arise  Outcome: Progressing Towards Goal  Pt came to staff to discuss feelings and concerns. Denies SI/HI.this shift  Goal: *STG: Attends activities and groups  Outcome: Progressing Towards Goal  Pt attended and participated in group activity this shift  Goal: *STG/LTG: Complies with medication therapy  Outcome: Progressing Towards Goal  Pt accepted medication this shift.    Problem: Falls - Risk of  Goal: *Absence of falls  Outcome: Progressing Towards Goal  Pt remained free of falls this shift.  Goal: *Knowledge of fall prevention  Outcome: Progressing Towards Goal  Pt verbalized knowledge of fall prevention

## 2015-09-14 NOTE — Progress Notes (Signed)
"   I slept so good last night" Pt is a medium build AAM hygiene is appropriate dressed in his clothes visible on the unit this morning for breakfast and medication. During 1;1 medication administration pt is future oriented and wants to be discharged to go home where he resides with his wife. Pt says he came to the hospital because he does not have a psychiatrist and ran out of medication .[place] [facility] and would like assistance being linked to a psychiatrist in the community. Denies SI/HI. Denies AH and VH. Q 15 minute observation maintained

## 2015-09-14 NOTE — Progress Notes (Signed)
Pt continues to refuse blood work and urine. Dr. Valentina LucksGriffin made aware.

## 2015-09-14 NOTE — Progress Notes (Signed)
PSYCHIATRY INPATIENT PROGRESS NOTE    Patient:  Kevin Coffey Age:  35 y.o. DOB:  Nov 19, 1980     SEX:  male MRN:  [id number] CSN:  [id number]    09/14/2015  3:21 PM    Admit Date:  09/11/2015 Attending:   Deloris Ping, MD    Today's Subjective Note:   I met with  [name]  mental state and behavior since his admission has not shown any signs of a major thought disorder, i.e., he has not been impulsive, intrusive, hyperverbal, or hyperactive. His thoughts have not been tangential, circumstantial, or shown loose associations, or flights of ideas.  His expressed goal oriented plans and is future oriented. His behavior has not been hyperactive or overly energetic in non-purposeful activity.  He has been pleasant during his admission and had not required any redirection from the staff.  He continues to  denies having suicidal, homicidal, or paranoia ideations,  and denies hallucinations. He does not report having grandiose ideas or having abnormal abilities or powers.  He denies having any muscle stiffness, tightness, or tremors or any medications side effects.  He reports his sleeps and appetite are good.      In addition, Mr. [name] since his admission, have not displayed any thought disorders, i.e. bizarre thinking that is far out of the main stream of thinking, Persecutory delusions that are unreasonable, (e.g., believing one is being followed and harassed by gangs), grandiose delusions (e.g., believing one is a Nurse, adult who owns casinos around the world), erotomanic delusions (e.g., believing a famous movie star is in love with them), Somatic delusions(e.g., believing one???s sinuses have been infested by worms), Delusions of reference (e.g., believing dialogue on a television program is directed specifically towards the patient), delusions of control (e.g., believing one???s thoughts and movements are controlled by planetary overlords).        [name]  have not shown any signs, symptoms, behaviors, or voice having hallucinations or act as if he is responding to internal stimuli or actively self-talking since his admission.  He has not shown any thought disorganization, i.e. as evident by his patterns of speech during the interviews, e.g., disorganized speech that is nonspecific to the topic and questions asked.  He does not have alogia or poverty of content, thought blocking, loosening of association, or have tangentiality, circumstantiality, clanging, or clang associations in his thinking.  His speech has been without word salad or perseveration on any ideas persistently during the interview.  He has not been agitated, aggressive, or display heightened emotional arousal, or increased motor activity during his admission.  His labs results do not show any metabolic conditions to suspect an underline metabolic dysfunction for any psychiatric condition that needs immediate attention.    Therefore, because [name] does not display any signs or symptoms indicating that he is at risk of harm to himself, others, or that he is unable to maintain himself without the need of a highly supervised, structured environment for self-protection and for the protection of the community at large, the treatment team decided that [name] does not need continue psychiatric hospitalization at this time and that he will be discharge in the AM with a scheduled follow up with his psychiatrist.     Current Medications:    Current Facility-Administered Medications   Medication Dose Route Frequency Provider Last Rate Last Dose   ??? lactulose (CHRONULAC) solution 10 g  10 g Oral  QPM Deloris Ping, MD   10 g at 09/13/15 2221   ??? nicotine (NICORETTE) gum 4 mg  4 mg Oral Q2H PRN Razel L Frey, NP       ??? albuterol-ipratropium (DUO-NEB) 2.5 MG-0.5 MG/3 ML  3 mL Nebulization Q6H PRN Razel L Frey, NP        ??? alum-mag hydroxide-simeth (MYLANTA) oral suspension 30 mL  30 mL Oral TID PRN Deloris Ping, MD       ??? acetaminophen (TYLENOL) tablet 1,000 mg  1,000 mg Oral Q6H PRN Deloris Ping, MD       ??? benzocaine-menthol (CEPACOL) lozenge 1 Lozenge  1 Lozenge Mucous Membrane PRN Deloris Ping, MD       ??? bisacodyl (DULCOLAX) tablet 5 mg  5 mg Oral DAILY PRN Deloris Ping, MD       ??? chlorhexidine (PERIDEX) 0.12 % mouthwash 10 mL  10 mL Oral BID PRN Deloris Ping, MD       ??? dicyclomine (BENTYL) capsule 10 mg  10 mg Oral QID PRN Deloris Ping, MD       ??? magnesium hydroxide (MILK OF MAGNESIA) 400 mg/5 mL oral suspension 30 mL  30 mL Oral QHS PRN Deloris Ping, MD       ??? ketoconazole (NIZORAL) 2 % cream   Topical BID PRN Deloris Ping, MD       ??? ondansetron (ZOFRAN ODT) tablet 4 mg  4 mg Oral Q6H PRN Deloris Ping, MD       ??? therapeutic multivitamin [facility]) tablet 1 Tab  1 Tab Oral DAILY Deloris Ping, MD   1 Tab at 09/14/15 [phone]   ??? Thiamine Mononitrate (B-1) tablet 100 mg  100 mg Oral NOW Deloris Ping, MD   100 mg at 09/12/15 1400   ??? white petrolatum (VASELINE) ointment   Topical PRN Deloris Ping, MD       ??? guaiFENesin (ROBITUSSIN) 100 mg/5 mL oral liquid 400 mg  400 mg Oral Q4H PRN Deloris Ping, MD       ??? folic acid (FOLVITE) tablet 1 mg  1 mg Oral DAILY Deloris Ping, MD   1 mg at 09/14/15 1027   ??? famotidine (PEPCID) tablet 20 mg  20 mg Oral BID PRN Deloris Ping, MD       ??? diphenoxylate-atropine (LOMOTIL) tablet 1 Tab  1 Tab Oral QID PRN Deloris Ping, MD       ??? diphenhydrAMINE (BENADRYL) capsule 100 mg  100 mg Oral QHS PRN Deloris Ping, MD   100 mg at 09/13/15 2219   ??? LORazepam (ATIVAN) tablet 2 mg  2 mg Oral Q4H PRN Deloris Ping, MD   2 mg at 09/14/15 1230   ??? OLANZapine (ZyPREXA zydis) disintegrating tablet 5 mg  5 mg Oral Q6H PRN Deloris Ping, MD       ??? diphenhydrAMINE (BENADRYL) capsule 50 mg  50 mg Oral Q6H PRN Deloris Ping, MD        ??? divalproex DR (DEPAKOTE) tablet 500 mg  500 mg Oral Q12H Deloris Ping, MD   500 mg at 09/14/15 2536   ??? QUEtiapine (SEROquel) tablet 200 mg  200 mg Oral QHS Deloris Ping, MD   200 mg at 09/13/15 2219   ??? hydrOXYzine pamoate (VISTARIL) capsule 50 mg  50 mg Oral Q6H PRN Deloris Ping, MD   50 mg at 09/12/15 1000   ??? cloNIDine HCl (CATAPRES) tablet 0.1 mg  0.1 mg Oral Q4H PRN Deloris Ping, MD   0.1  mg at 09/13/15 1613        Compliant with medication:       Yes    Side effects from medications:  No          Vital signs:  Blood pressure 124/83, pulse 83, temperature 97.6 ??F (36.4 ??C), resp. rate 20, height 5' 8"  (1.727 m), weight 63.5 kg (140 lb), SpO2 100 %.    Last test results and Reviewed in last 24 hours:  No results found for this or any previous visit (from the past 24 hour(s)).      No Known Allergies     Mental Status Exam:  Appearance and General Behavior  Gait  Connection with Provider  Clothing:  Eye Contact  Attention Span  Psychomotor Activity  Abnormal involuntary Movements?? WDWN & fairly groomed & good hygiene   Stable   Good ??  Hospital   Normal    Good??????????   No Psychomotor Retardation ??or Agitation  None????????????????????????    Speech form/content/Language Associations  Speech rate:????& Speech rhythm  Volume:  Tone/Prosody:????   Dysarthria:??         ??Formal Thought Disorder:  Pressured Speech:??  Thought process:   Normal  Normal??   Normal  None????????      None????????   logical; Goal directed, future oriented, Clear, Coherent, Organized, informative, no distortions, No circumstantial, tangential, or loose associations   Stated mood:????  Objectively appears:   Affect  Affect is:??  Facial Expression:  Vital Sense (Physical well-being):??  Self-Attitude  Hopefulness for the future:                 SI/HI/PDW ??  Passive death wish:  Thoughts/Intent/Plan to harm self:????????????    Thoughts/Intent/Plan to harm others:????   I'm feeling better  appropriate to circumstances and concerned  Full   Congruent with stated Mood??& Congruent with Affect???? ??  Full Expressive gestures   Good   Good  Good    Denies having??thoughts of dying and suicide and have No Plans or Intentions  Denies and No Plan   Denies and No Plan    Abnormal Perceptions ??and illusions & Hallucinations: Denies and  does not appear to be responding to internal stimuli   Delusions ?? None??   Anxiety ?? Obsessions Behavior:??None;??????Obsessive Thoughts:??None; Complaints of Anxiety:??None   Alertness ??and ??Orientation Alert and Oriented to person, time, and place:?????????????????????????????????????????????????????????????????????????????????? ??   Judgment & Insight ??Presently Situation testing: Good; Judgment:??Good; Insight:??Good     Axis 1:    Patient Active Problem List   Diagnosis Code   ??? Essential hypertension I10   ??? Mild intermittent asthma without complication O84.16   ??? Cigarette nicotine dependence with withdrawal ([place]) F17.213   ??? Marital relationship problem Z63.0   ??? Mood disorder ([facility]) F39   ??? Antisocial personality disorder in adult F60.2   ??? Hyperammonemia ([place]) E72.20       Competency Statement:   At the current time, the patient is competent to make informed consent regarding their current medical care and discharge planning and/or financial decisions.    PLAN & THERAPEUTIC GOALS:   THERAPEUTIC GOALS:  1. Mr. TONYA CARLILE  with actively participate in discharge planning.   2. Mr. ECHO ALLSBROOK   will be compliant in taking all prescribed psychiatric medications  3. Mr. EYDAN CHIANESE   will have a decrease intensity of his Unstable Mood Symptoms,  i.e.,  elated mood, inappropriate behavior, poor and intrusive boundaries, agitation, aggression, impatience,  excessive talking, and disorganization  4. Mr. CURLEE BOGAN   will attend and participate in Community/milieu activities.   5. Mr. DANYEL GRIESS   will be visible and interact with peers and staff appropriately in the milieu.    PLAN   Continue inpatient psychiatric hospitalization on level: Routine precautions  1. Continue current medications,discussed medication risk benefit profile and potential side effects.  2. Medication reconciliation completed with the patient and within the  EMR.   3. Medication education, i.e. Discussion of medication risk and benefits;  Patient was informed of the following medication risk and side effects, abnormal and painful muscle tightness and spasm, involuntary muscle movements, e.g. lip smacking, tongue and hand trembling, sedation, dryness of eyes, mouth, and nose, difficulty in passing urine and or stool (constipation), problems of ejaculation, orgasm, and erection, seizures, excessive or decrease in appetite, weight, or sexual interest, risk of heart disease, strokes, hypertension, and hypercholesterolemia that may result in early death if labs and medication are not monitored regularly, i.e. monthly or every 3 months, a chance of  emotional instability, e.g. mania, depression, suicidal thoughts or behavior, and risk of priapism, i.e. prolong erection of the penis or clitoris that might result in permanent damage or nonfunctioning.    4. Supportive psychotherapy.    5. Response to medications is good  6. Insight oriented therapy  7. Group attendance/processing    8. Relapse prevention  9. Somatic Management   10. continue to build rapport.   11. Medication changes/Orders as follows: None  12. Lab Orders: None - Note: avoid having urine tox screen        13. Appropriate disposition not finalized   14. Disposition planning   15. Continue discharge planning -discharge home tomorrow  16. Discharge Recommendations:  Mental Health Services,     ???I have considered all of the patient-specific risk factors that increase (e.g. race, gender, family history, personal history of attempts, access to lethal means) and mitigate (e.g. strong family supports, positive  coping skills, demographic features) suicide risk in this patient in formulating the patient???s current risk profile. This assessment will be revised based on Mr. Abdulla A Osorto???s clinical condition and any new personal or environmental variables that are discovered or arise.??? Mr. SLAYTON LUBITZ is not endorsing suicidal ideation.      I certify that this patient's inpatient psychiatric hospital services furnished since the previous certification were, and continue to be, required for treatment that could reasonably be expected to improve the patient's condition, or for diagnostic study, and that the patient continues to need, on a daily basis, active treatment furnished directly by or requiring the supervision of inpatient psychiatric facility personnel.  In addition thehospital records show that services furnished were intensive treatment services, admission or related services, or equivalent services.    Deloris Ping, MD   09/14/2015 3:21 PM

## 2015-09-14 NOTE — Other (Signed)
Verbal shift change report given by Timothy Udoji, RN   (offgoing nurse).  Report given with SBAR, Kardex, MAR and Recent Results.

## 2015-09-15 MED ORDER — DIVALPROEX 500 MG 24 HR TAB
500 mg | ORAL_TABLET | Freq: Every day | ORAL | 0 refills | Status: AC
Start: 2015-09-15 — End: 2015-10-15

## 2015-09-15 MED ORDER — LACTULOSE 10 GRAM/15 ML ORAL SOLUTION
10 gram/15 mL | Freq: Every evening | ORAL | 0 refills | Status: AC
Start: 2015-09-15 — End: 2015-09-17

## 2015-09-15 MED ORDER — THERAPEUTIC MULTIVITAMIN TAB
ORAL_TABLET | Freq: Every day | ORAL | 0 refills | Status: AC
Start: 2015-09-15 — End: 2015-10-15

## 2015-09-15 MED ORDER — QUETIAPINE 200 MG TAB
200 mg | ORAL_TABLET | Freq: Every evening | ORAL | 0 refills | Status: AC
Start: 2015-09-15 — End: 2015-10-15

## 2015-09-15 MED FILL — CLONIDINE 0.1 MG TAB: 0.1 mg | ORAL | Qty: 1

## 2015-09-15 MED FILL — LORAZEPAM 2 MG TAB: 2 mg | ORAL | Qty: 1

## 2015-09-15 MED FILL — FOLIC ACID 1 MG TAB: 1 mg | ORAL | Qty: 1

## 2015-09-15 MED FILL — THERA 400 MCG TABLET: 400 mcg | ORAL | Qty: 1

## 2015-09-15 MED FILL — HYDROXYZINE PAMOATE 50 MG CAP: 50 mg | ORAL | Qty: 1

## 2015-09-15 MED FILL — DIVALPROEX 500 MG TAB, DELAYED RELEASE: 500 mg | ORAL | Qty: 1

## 2015-09-15 MED FILL — QUETIAPINE 200 MG TAB: 200 mg | ORAL | Qty: 1

## 2015-09-15 MED FILL — DIPHENHYDRAMINE 50 MG CAP: 50 mg | ORAL | Qty: 1

## 2015-09-15 MED FILL — THIAMINE MONONITRATE 100 MG TABLET: 100 mg | ORAL | Qty: 1

## 2015-09-15 MED FILL — DIPHENHYDRAMINE 50 MG CAP: 50 mg | ORAL | Qty: 2

## 2015-09-15 MED FILL — OLANZAPINE 5 MG TAB, RAPID DISSOLVE: 5 mg | ORAL | Qty: 1

## 2015-09-15 NOTE — Discharge Summary (Signed)
Behavorial Health  Discharge Summary     Patient: Kevin Coffey MRN: [id number]  SSN: [id number]    Date of Birth: [birth year]  Age: 35 y.o.  Sex: male        Admit Date:    09/11/2015    Discharge date:   09/15/2015  2:20 PM    Attending Provider:   Deloris Ping, MD    Discharge Diagnoses:    Patient Active Problem List   Diagnosis Code   ??? Essential hypertension I10   ??? Mild intermittent asthma without complication A63.01   ??? Cigarette nicotine dependence with withdrawal ([place]) F17.213   ??? Marital relationship problem Z63.0   ??? Mood disorder ([place]) F39   ??? Antisocial personality disorder in adult F60.2   ??? Hyperammonemia ([place]) E72.20              Admission Psychiatry History and Physical  ??  09/12/2015  1:46 PM  ??  Admit Date: 09/11/2015 Attending: Deloris Ping, M.D.   ??  Date of Evaluation: 09/12/2015  ??  Subjective:   According to the [facility] ED note, [name] Is a 35 y.o. male [place] with h/o anxiety disorder and depression who presents to the ED with complaint of worsening depression and aggression. He states he has been "crying more frequently and throwing stuff in his house." Pt states he was released from prison and ran out of his medication last December. He has been noncompliant with his Depakote and Seroquel prescription since then. Pt denies fever, HA, N/V/D, AH/VH, SI/HI. Pt denies recent EtOH and illicit drug use.   ????  Patient is a 35 y.o. male presenting with mental health disorder. The history is provided by the patient.   Mental Health Problem   This is a new problem. The current episode started more than 1 week ago. The problem has not changed since onset.Associated symptoms include agitation and violence. Pertinent negatives include no weakness and no hallucinations. His past medical history is significant for depression.   _________________________________________________________________  ??  Information from the [facility] ED Care Management Notes: Pt is a 35y/o AAM who  was escorted to the ER by his wife c/o pt with S/I no plan. Pt presents calm, cooperative, alert, OX3 and despairing. Pt states he has been off his meds since Sept. (Depakote, Seroquel & Xanax) due to on/off reasons of going to jail in Nov and moving. Pt states lately he has been having violent episodes where the police have been called to his home for D/V. He states he is depressed and been threatening his wife. Pt states H/I towards his wife and reports having told his wife that yesterday he thought about how he was going to kill her and get rid of her body. SW requested number to contact pt's wife he states he does not have and that she is at work. SW will call number on face sheet as duty to warn. SW called [phone] and left V/M to wife's recording which had her name on voicemail. Pt states he just doesn't care anymore about life as it's hectic. 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 Pt denies hallucinations or any hx of. Pt denies SA and tox screens are negative but does admit to having used marijuana in the X. Pt reports being seen  in the ER yesterday as he states he fell and hurt himself. He reports health conditions of asthma. Pt had no other concerns.   ????  AXIS 1: Bipolar 1  AXIS 2: Deferred  AXIS 3: Asthma, recent fall  AXIS 4: noncompliance with mh tx, problems with primary  AXIS 5: 34  ????  SW consulted with Dr Laurann Montana pt is to be admitted for further evalutaton and stabilization. SW spoke with Elmyra Ricks from [facility]. Pt authorized for 5 days beginning today with a review on Tuesday, May 30th. Auth# [id number].   Kevin Coffey, MSW Care Management Signed ?? Progress Notes Date of Service: 09/11/15 2017   ??  Fanny Skates Nurse Admission Note: Pt is a [age]-year-old male who was admitted form the ED. On arrival to unit, pt was calm, but impatient and  irritable. He kept repeating that he was very depressed and that he has not taken his medication since September. Pt also stated, " I have a lot going on, a whole lot. I do not want to talk about it now. 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 Pt kept repeating that he has a lot going on, but will not elaborate. Pt also did not tell Probation officer that he was in prison. Pt just kept repeating, " They took all the information down stairs. What else do you want to know." However, pt took all his medications. He admitted to feeling of depression, but denies wanting to hurt self, or others. Pt denies any medical history, or drug used except marijuana. Pt admitted to smoking marijuana, but stated that he does not do it often. Tox screen was positive for marijuana. Writer educated pt on the rules and regulations of the unit.Pt is presently resting quietly in bed. Q 15 minutes checks maintained and pt made comfortable.  Marden Noble, RN Registered Nurse Signed NURSING Progress Notes Date of Service: 09/12/15 0110   ??  Past [facility] History:  Admit Date: 10/10/2013  Discharge date: 10/13/13  Discharge Diagnostic Impression  ????  Axis I: Bipolar Disorder, Manic Phase. Alcohol Use DO  ????  Axis II: none  Axis III:   ?? ?? ?? ??   Past Medical History   Diagnosis Date   ??? Psychiatric disorder ????   ???? ???? Depression   ??? Aggressive outburst ????   ??? Anxiety disorder ????   ??? Depression ????   ????  Axis IV: social  Axis V: 55  ????  Allergies: No Known Allergies   ????  Discharge medications:   No current facility-administered medications for this encounter.   ????  ?? ?? ??   Current Outpatient Prescriptions   Medication Sig   ??? divalproex DR (DEPAKOTE) 500 mg tablet Take 1 Tab by mouth two (2) times a day.   ??? QUEtiapine (SEROQUEL) 100 mg tablet Take 1 Tab by mouth two (2) times a day.   ????       Follow-up Information   ??  Comments Contact Info   ???? Pt will follow-up @ [facility] [address] [place], [place]. Appointment on October 17, 2013 @ 12:45pm with Dr. Maudie Mercury Pt will return to home @ [address] [place].[facility] Family/ Phone [phone]   ??  Psych History:   Reports his first psychiatric admission was in September  of 2015 at [facility] and this is his only other admission. He denies attending following up appointment on discharge.   ??       Patient Active Problem List   ?? Diagnosis Date Noted   ??? Major depressive disorder 09/12/2015   ??? Essential hypertension 09/12/2015   ??? Alcohol abuse, continuous 09/12/2015   ??? Mild intermittent asthma without complication 60/45/4098   ??? Tobacco use 09/12/2015   ??? Marijuana use 09/12/2015   ??? Bipolar 1 disorder ([place]) 10/10/2013   ??  Psych Medication prior to Admission:   Medication Sig Start Date End Date   ALPRAZolam (XANAX) 0.5 mg tablet Take 0.5 mg by mouth two (2) times daily as needed for Anxiety. ???? ????   divalproex DR (DEPAKOTE) 500 mg tablet Take 1 Tab by mouth two (2) times a day. 10/13/13 Admits to not taking any medications since his last [facility] admission in September of 2016   QUEtiapine (SEROQUEL) 100 mg tablet Take 1 Tab by mouth two (2) times a day. 10/13/13 Admits to not taking any medications since his last [facility] admission in September of 2016   ??  Medical History:       Past Medical History:   Diagnosis Date   ??? Aggressive outburst ??   ??? Anxiety disorder ??   ??? Asthma ??   ??? Depression ??   ??? Psychiatric disorder ??   ?? Depression   ??  History reviewed. No pertinent surgical history.  No Known Allergies   ??  Social/Education/Work History:   Social History   ??        Social History   ??? Marital status: MARRIED x 7 years after 2 years of dating. He met his wife in [place] when she was studying to get her Master degree. His wife works at the same school that as he Nurse, adult.   ?? ?? Spouse name: N/A   ??? Number of  children: None of his own but his Wife has 4 children, three girls, ages 73, 38 & 73 and a 78 year old son. He reports his 66 year old son is entitled and splits him and his wife with his father who he has been living with x 2.5 years. He states his son's father brought him a new BMW. He states his daughters stated they are trying to be neutral in the parenting splitting.    ??? Years of education: High School Grad., Cosmology Certification and Cabin crew Certification    ??  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 Reports loving his job.  ??  Family History:   Significant for HTN, DM, Cancer- bone, breast, cervical   Reports his mother is an alcoholic. He states his 5 year old brother has been a long term psychiatric resident.  ??  Legal History:   Reports on Probation now. States at the age of 73 after graduating from [employer] was arrested with a loaded gun and served 96 months of a 10 years sentence. His 2nd arrest was in March of 2016 for domestic violence. His 3rd arrest was on October 2016 to December 2016 for destruction of property, i.e. Breaking windows in their home. He reported having a total of 3 charges for Domestic violence, two of the charges dropped except for the last arrested.  ??  Substance Abuse History:  Social History   Substance Use Topics   ??? Smoking status: Current Every Day Smoker   ?? ?? Packs/day: 1.5 pack eday   ?? ?? Years: 20.00   ?? ?? Types: Cigarettes   ??? Smokeless tobacco: Never Used   ??? Alcohol use Yes    Reports stopped using Marijuana in September of 2016  ??  ??  Objective:   Vital signs: Blood pressure 101/61, pulse 62, temperature 98.4 ??F (36.9 ??C), resp. rate 18, height 5' 8"  (1.727 m), weight 63.5 kg (140 lb), SpO2 98 %.Patient Vitals for the past 8 hrs:  ?? BP Temp Pulse Resp   09/12/15 0830 101/61 98.4 ??F (36.9 ??C) 62 18   ??  Last test results and Reviewed in last 24 hours:   Recent Results           Recent Results (from the past 24 hour(s))   METABOLIC PANEL, COMPREHENSIVE   ?? Collection Time: 09/11/15 5:08 PM   Result Value Ref Range   ?? Sodium 142 136 - 145 mmol/L   ?? Potassium 4.1 3.50 - 5.10 mmol/L   ?? Chloride 104 98 - 107 mmol/L   ?? CO2 27 21 - 32 mmol/L   ?? Anion gap 15 10 - 17 mmol/L   ?? Glucose 75 74 - 106 mg/dL   ?? BUN 21 (H) 7 - 18 MG/DL   ?? Creatinine 0.92 0.6 - 1.3 MG/DL   ?? BUN/Creatinine ratio 23 (H) 6.0 - 20.0    ?? GFR est AA >60 >60 ml/min/1.80m   ?? GFR est non-AA >60 >60 ml/min/1.762m  ?? Calcium 8.9 8.5 - 10.1 MG/DL   ?? Bilirubin, total 0.5 0.20 - 1.00 MG/DL   ?? ALT (SGPT) 25 12.0 - 78.0 U/L   ?? AST (SGOT) 22 15 - 37 U/L   ?? Alk. phosphatase 73 46 - 116 U/L   ?? Protein, total 7.6 6.40 - 8.20 g/dL   ?? Albumin 3.9 3.40 - 5.00 g/dL   ?? Globulin 3.7 g/dL   ?? A-G Ratio 1.1 1.0 - 3.1    CBC WITH AUTOMATED DIFF   ?? Collection Time: 09/11/15 5:08 PM   Result Value Ref Range   ?? WBC 3.5 (L) 4.8 - 10.8 K/uL   ?? RBC 4.94 4.0 - 5.2 M/uL   ?? HGB 13.7 12.8 - 15.0 g/dL   ?? HCT 38.9 36.8 - 45.2 %   ?? MCV 78.7 (L) 81 - 99 FL   ?? MCH 27.7 27 - 31 PG   ?? MCHC 35.2 32 - 36 g/dL   ?? RDW 15.1 (H) 11.5 - 14.5 %   ?? PLATELET 187 140 - 450 K/uL   ?? MPV 9.9 7.4 - 10.4 FL   ?? NEUTROPHILS 42 40 - 70 %   ?? LYMPHOCYTES 51 (H) 16 - 40 %   ?? MONOCYTES 6 0 - 12 %   ?? EOSINOPHILS 1 0 - 5 %   ?? BASOPHILS 0 0 - 2 %   ?? ABS. NEUTROPHILS 1.5 1.4 - 6.5 K/UL   ?? ABS. LYMPHOCYTES 1.8 1.2 - 3.4 K/UL   ?? ABS. MONOCYTES 0.2 (L) 1.1 - 3.2 K/UL   ?? ABS. EOSINOPHILS 0.0 0.0 - 0.7 K/UL   ?? ABS. BASOPHILS 0.0 0.0 - 0.2 K/UL   ?? DF AUTOMATED ????   ?? NRBC 0.0 0 PER 100 WBC   ?? IMMATURE GRANULOCYTES 0.3 0.0 - 5.0 %   ETHYL ALCOHOL   ??  Collection Time: 09/11/15 5:08 PM   Result Value Ref Range   ?? ALCOHOL(ETHYL),SERUM <3 <3.0 MG/DL   HEMOGLOBIN A1C W/O EAG   ?? Collection Time: 09/12/15 7:00 AM   Result Value Ref Range   ?? Hemoglobin A1c 5.9 4.5 - 6.2 %   LIPID PANEL   ?? Collection Time: 09/12/15 7:00 AM   Result Value Ref Range   ?? LIPID  PROFILE ???? ????   ?? Cholesterol, total 136 <200 MG/DL   ?? Triglyceride 77 0 - 200 MG/DL   ?? HDL Cholesterol 76 (H) 40 - 60 MG/DL   ?? LDL, calculated 44.6 0 - 130 MG/DL   ?? VLDL, calculated 15.4 MG/DL   ?? CHOL/HDL Ratio 1.8 ????   ?? LDL/HDL Ratio 0.6 ????   TSH 3RD GENERATION   ?? Collection Time: 09/12/15 7:00 AM   Result Value Ref Range   ?? TSH 0.82 0.55 - 7.78 uIU/mL      ??  ????  ??  Current Facility-Administered Medications:   ??? nicotine (NICORETTE) gum 4 mg, 4 mg, Oral, Q2H PRN, Razel L Frey, NP  ??? albuterol-ipratropium (DUO-NEB) 2.5 MG-0.5 MG/3 ML, 3 mL, Nebulization, Q6H PRN, Razel L Frey, NP  ??? alum-mag hydroxide-simeth (MYLANTA) oral suspension 30 mL, 30 mL, Oral, TID PRN, Deloris Ping, MD  ??? acetaminophen (TYLENOL) tablet 1,000 mg, 1,000 mg, Oral, Q6H PRN, Deloris Ping, MD  ??? benzocaine-menthol (CEPACOL) lozenge 1 Lozenge, 1 Lozenge, Mucous Membrane, PRN, Deloris Ping, MD  ??? bisacodyl (DULCOLAX) tablet 5 mg, 5 mg, Oral, DAILY PRN, Deloris Ping, MD  ??? chlorhexidine (PERIDEX) 0.12 % mouthwash 10 mL, 10 mL, Oral, BID PRN, Deloris Ping, MD  ??? dicyclomine (BENTYL) capsule 10 mg, 10 mg, Oral, QID PRN, Deloris Ping, MD  ??? magnesium hydroxide (MILK OF MAGNESIA) 400 mg/5 mL oral suspension 30 mL, 30 mL, Oral, QHS PRN, Deloris Ping, MD  ??? ketoconazole (NIZORAL) 2 % cream, , Topical, BID PRN, Deloris Ping, MD  ??? ondansetron (ZOFRAN ODT) tablet 4 mg, 4 mg, Oral, Q6H PRN, Deloris Ping, MD  ??? [START ON 09/13/2015] therapeutic multivitamin (THERAGRAN) tablet 1 Tab, 1 Tab, Oral, DAILY, Deloris Ping, MD  ??? Thiamine Mononitrate (B-1) tablet 100 mg, 100 mg, Oral, NOW, Deloris Ping, MD  ??? white petrolatum (VASELINE) ointment, , Topical, PRN, Deloris Ping, MD  ??? guaiFENesin (ROBITUSSIN) 100 mg/5 mL oral liquid 400 mg, 400 mg, Oral, Q4H PRN, Deloris Ping, MD  ??? [START ON 4/69/6295] folic acid (FOLVITE) tablet 1 mg, 1 mg, Oral, DAILY, Deloris Ping, MD  ??? famotidine (PEPCID) tablet 20 mg, 20 mg, Oral, BID PRN,  Deloris Ping, MD  ??? diphenoxylate-atropine (LOMOTIL) tablet 1 Tab, 1 Tab, Oral, QID PRN, Deloris Ping, MD  ??? diphenhydrAMINE (BENADRYL) capsule 100 mg, 100 mg, Oral, QHS PRN, Deloris Ping, MD  ??? LORazepam (ATIVAN) tablet 2 mg, 2 mg, Oral, Q4H PRN, Deloris Ping, MD  ??? OLANZapine (ZyPREXA zydis) disintegrating tablet 5 mg, 5 mg, Oral, Q6H PRN, Deloris Ping, MD  ??? diphenhydrAMINE (BENADRYL) capsule 50 mg, 50 mg, Oral, Q6H PRN, Deloris Ping, MD  ??? QUEtiapine (SEROquel) tablet 200 mg, 200 mg, Oral, TID, Deloris Ping, MD  ??? divalproex DR (DEPAKOTE) tablet 500 mg, 500 mg, Oral, TID, Deloris Ping, MD, 500 mg at 09/12/15 1000  ??? hydrOXYzine pamoate (VISTARIL) capsule 50 mg, 50 mg, Oral, Q6H PRN, Deloris Ping, MD, 50 mg at 09/12/15 1000  ??? cloNIDine HCl (CATAPRES) tablet 0.1 mg, 0.1 mg, Oral, Q4H PRN, Deloris Ping,  MD, 0.1 mg at 09/12/15 1000  ??  Physical Exam: IP Medical Consult Exam:  ??  Date/Time: 09/12/2015 12:21 PM  ????  Subjective:   REQUESTING PHYSICIAN: Dr. Laurann Montana  ??  REASON FOR CONSULT: Medical Management   Kevin Coffey is a 35 y.o. African American male who I was asked to see for medical management. Pt a/o x3. Pt able to answer simple questions and follows commands. 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 Pt begins to raise his voice when he discusses the psych unit and feels it is "disorganized" and he feels like he has no control. Denies SI, HI and hallucinations. Denies SOB, CP, palpitations, N/V/D.  ????  REVIEW OF SYSTEMS:   All other systems negative on 10 system reviewed are normal except for as stated in HPI.  ????  ?? ?? ??   Past Medical History:   Diagnosis Date   ??? Aggressive outburst ????   ??? Anxiety disorder ????   ??? Asthma ????   ??? Depression ????   ??? Psychiatric disorder ????   ???? Depression   ????  ????  History reviewed. No pertinent surgical history.  ????  ????  SOCIAL HISTORY:       ?? Social History   ??     ????  ?? ?? ?? ??   Social History   ??? Marital status: MARRIED    ???? ???? Spouse name: N/A   ??? Number of children: N/A   ??? Years of education: N/A   ????  ?? ?? ?? ?? ??   Social History Main Topics   ??? Smoking status: Current Every Day Smoker   ???? ???? Packs/day: 0.50   ???? ???? Years: 20.00   ???? ???? Types: Cigarettes   ??? Smokeless tobacco: Never Used   ??? Alcohol use Yes ??   ??? Drug use: 1.00 per week   ???? ???? Special: Marijuana   ??? Sexual activity: Yes   ???? ???? Partners: Female   ????  ?? ?? ??   Other Topics Concern   ??? None   ????   ????  ????  Family History: Significant for HTN, DM, Cancer- bone, breast, cervical    ????  No Known Allergies   ????  ?? ?? ?? ?? ?? ??   Prior to Admission medications    Medication Sig Start Date End Date Taking? Authorizing Provider   ALPRAZolam Duanne Moron) 0.5 mg tablet Take 0.5 mg by mouth two (2) times daily as needed for Anxiety. ???? ???? ???? Phys Other, MD   divalproex DR (DEPAKOTE) 500 mg tablet Take 1 Tab by mouth two (2) times a day. 10/13/13 ???? ???? Nell Range, MD   QUEtiapine (SEROQUEL) 100 mg tablet Take 1 Tab by mouth two (2) times a day. 10/13/13 ???? ???? Nell Range, MD   ????  ????  ????  ????  Objective:   VITALS:   ?? ?? ??   Visit Vitals   ??? BP 101/61 (BP Patient Position: At rest)   ??? Pulse 62   ??? Temp 98.4 ??F (36.9 ??C)   ??? Resp 18   ??? Ht 5' 8"  (1.727 m)   ??? Wt 63.5 kg (140 lb)   ??? SpO2 98%   ??? BMI 21.29 kg/m2   ????  Temp (24hrs), Avg:98.4 ??F (36.9 ??C), Min:97.9 ??F (36.6 ??C), Max:99 ??F (37.2 ??C)  ????  ????  PHYSICAL EXAM:   ????  General:  HEENT:  No acute distress. Appears stated age.  Neck supple. Trachea midline. PEERLA, no icterus.   Lungs:  ???? Clear to auscultation bilaterally. No wheezing/rales/rhonchi. No accessory muscle use. No tachypnea.   Chest wall:  No tenderness or deformity.   Heart:  Regular rate and rhythm, S1, S2 normal, no murmurs, clicks, rubs or gallops. No JVD. No carotid bruits.    Abdomen:  Soft, non-tender/non-distended. Bowel sounds normal. No masses, No organomegaly. No guarding or rigidity   Extremities: Extremities normal, atraumatic, no cyanosis or edema.    Pulses: 2+ and symmetric all extremities.   Skin: Skin color, texture, turgor normal. No rashes or lesions   Neurologic:  ???? CNII-XII intact. No focal motor or sensory deficits appreciated. AAOx3   ????  LAB DATA REVIEWED:   ?? ??   Recent Labs   ???? 09/11/15  1708   WBC 3.5*   HGB 13.7   HCT 38.9   PLT 187   ????  ?? ??   Recent Labs   ???? 09/11/15  1708   NA 142   K 4.1   CL 104   CO2 27   GLU 75   BUN 21*   CREA 0.92   CA 8.9   ALB 3.9   SGOT 22   ALT 25   ????  No results for input(s): PH, PCO2, PO2, HCO3, FIO2 in the last 72 hours.  ????  IMAGING RESULTS:  No results found.  ????  REVIEW OF PREVIOUS RECORDS:  ????  Recommendations/Plan:   ????  Assessment  ????                  Hospital Problems  Date Reviewed: 2015/10/08      ?? ???? ???? ???? Codes Class Noted POA      ???? * (Principal)Bipolar 1 disorder ([place]) ICD-10-CM: F31.9  ICD-9-CM: 296.7 ???? 10/10/2013 Yes   ???? ????      ???? Major depressive disorder ICD-10-CM: F32.9  ICD-9-CM: 296.20 ???? Oct 08, 2015 Yes   ???? ????      ???? Essential hypertension ICD-10-CM: I10  ICD-9-CM: 401.9 ???? Oct 08, 2015 Yes   ???? ????      ???? Alcohol abuse, continuous ICD-10-CM: F10.10  ICD-9-CM: 305.01 ???? 10/08/2015 Yes   ???? ????      ???? Mild intermittent asthma without complication ZOX-09-UE: A54.09  ICD-9-CM: 493.90 ???? 08-Oct-2015 Yes   ???? ????      ???? Tobacco use ICD-10-CM: Z72.0  ICD-9-CM: 305.1 ???? 08-Oct-2015 Yes   ???? ????      ???? Marijuana use ICD-10-CM: F12.10  ICD-9-CM: 305.20 ???? Oct 08, 2015 Yes   ???? ????      ??                ????   ????  ___________________________________________________  PLAN/RECOMMENDATIONS:   1. All above psych diagnoses to be managed by attending psych team.  2. HTN- pt has history of hypertension, but is well controlled without medication outpatient. F/u vitals.   3. Asthma- no acute exacerbation. PRN duonebs. F/u pulse ox.   4. Alcohol abuse- to be managed by psych team. Pt counseled to stop drinking.   5. Tobacco use- pt counseled to stop smoking. Nicorette gum PRN.   6. Marijuana abuse- to be managed by  psych team. Pt counseled to stop marijuana abuse.   7. Pt to follow up outpatient with PCP.  ????  ????  Will sign off at this time. Please call with questions.  ????  Total Time : 35 minutes   ??  Hospitalist: Trixie Dredge, NP - 09/12/2015  _________________________________________________________________  ??  [facility] Psychiatric Admission Evaluation: I met with Kevin Coffey today and asked what is his understanding for the reason for his admission to the hospital. Kevin Coffey stated that "I needed to get back on my medicine. 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 My wife gave me an ultimatum to coming into the hospital and get back on my medicine if I loved her and I decided to come to the ED. When they asked if I had thoughts of doing self-harm I stated I did but I never said I had any plans or intentions because I love my wife and children and I will not kill myself. I did not state I was homicidal or hearing voices. I just want to get back on my medicines and go home and back to work ASAP." Kevin Coffey  denies having auditory hallucinations and have not display symptoms or signs of responding to internal stimuli or self-talking. He denies feelings of hopelessness, helplessness, or worthlessness and denies having any suicidal, homicidal, or paranoid ideations. He admits to having racing thoughts, irritable and angry mood but denies having any thoughts or intentions of doing self-harm or harm to anyone.  ??  Mental Status Exam On Admission:  Appearance and ??General Behavior  General appearance:??   Gait  Connection with Provider  Clothing:??????????????????????????????  Eye Contact  Attention Span  Psychomotor Activity  Abnormal Movements?? ??  WDWN and well kept  Stable  Good ??  hospital   WNL  Good????????   None   None   Speech form and content, Language/Associations  Speech  rate:  Speech rhythm:  Volume:  Tone/Prosody:  Dysarthria:  Form of Thought ??Disorder:  Pressured Speech:??????  Thought process: ??  Normal??& Spontaneous   Normal  Normal  Curt & Tense sometimes  None  ??  None  Clear, Coherent, Goal Directed, future oriented, Organized, No distortions, No Flight of Ideas &?? informative??and logical, no circumstantiality or tangential thinnking   Stated mood:  Objectively appears:   Affect  Affect is:   Facial Expression:????  Vital Sense (Physical well-being):??????????  Self-Attitude  SI/HI/PDW ??  Hopefulness for the future:  Passive death wish:  Thoughts/Intent/Plan to harm self:  Thoughts/Intent/Plan to harm others: Alright but regretful for stopping medications  appropriate to circumstances and Concerns  Constricted  Congruent with stated Mood???? ??  Irritable Expressive gestures ??   Good  Good  ??  Good  denied??  No intention and strongly denies  No intention and strongly denies   Abnormal Perceptions: Illusions / Hallucinations:  denied??& Not responding to any internal stimuli??   Delusions ?? None???? ??   Anxiety ?? Obsessions Behavior:??None;??Obsessive Thoughts:??None; ??  Complaints of Anxiety: None   Alertness ??and ??Orientation Alert and Oriented to person, time, and place?????????????????????????????????????????????????????????????????????????????????? ??   Judgment & Insight ?? Situation testing: Good; Judgment: Fair ; Insight:Good    Impression:    Axis I:          Patient Active Problem List   Diagnosis Code   ??? Essential hypertension I10   ??? Mild intermittent asthma without complication N82.95   ??? Cigarette nicotine dependence with withdrawal ([facility]) F17.213   ??? Bipolar affective disorder, current episode severe ([place]) F31.9   ???  Marital relationship problem Z63.0   ??  Axis II:  Deferred  ??  Axis III:         Past Medical History:   Diagnosis Date   ??? Aggressive outburst ??   ??? Anxiety disorder ??   ??? Asthma ??   ??? Depression ??   ??? Psychiatric disorder ??   ?? Depression      No Known Allergies  ??  Axis IV:  Acute: Problems with primary  support group   Problems related to social environment,   Other psychosocial or environmental problems   Problems with Legal System  Chronic Psychiatric Problems  Chronic Noncompliance with Psychiatric Treatment   ????  Axis V: Current -   21-30; Past Year: 57-50   ????  Plan:   ??  Competency Statement:   At the current time, the patient is competent to make informed consent regarding their current medical care and discharge planning and/or financial decisions.  ??  Recommendations for Treatment/Conditions:  Psychiatric Inpatient treatment on [facility] is recommended while in hospital  Outpatient follow up recommended after release  ????  THERAPEUTIC GOALS AND PLANS:  ??  THERAPEUTIC GOALS:  1. Kevin Coffey with actively participate in discharge planning.   2. Kevin Coffey will be compliant in taking all prescribed psychiatric medications  3. Kevin Coffey will have a decrease intensity of his Depressed & Suicidal Symptoms, i.e., self-isolations, depress/sad mood, responding to internal stimuli, inappropriate behavior, poor and intrusive boundaries, agitation, aggression, paranoia.  4. Kevin Coffey will attend and participate in Community/milieu activities.   5. Kevin Coffey will be visible and interact with peers and staff appropriately in the milieu.  ??  PLANS:  ??  Continue inpatient psychiatric hospitalization on level: Routine precautions  1. Continue current medications,discussed medication risk benefit profile and potential side effects.  2. Medication reconciliation completed with the patient and within the  EMR.   3. Medication education, i.e. Discussion of medication risk and benefits; Patient was informed of the following medication risk and side effects, abnormal and painful muscle tightness and spasm, involuntary muscle movements, e.g. lip smacking, tongue and hand trembling, sedation, dryness of eyes, mouth, and nose, difficulty in passing urine and or  stool (constipation), problems of ejaculation, orgasm, and erection, seizures, excessive or decrease in appetite, weight, or sexual interest, risk of heart disease, strokes, hypertension, and hypercholesterolemia that may result in early death if labs and medication are not monitored regularly, i.e. monthly or every 3 months, a chance of  emotional instability, e.g. mania, depression, suicidal thoughts or behavior, and risk of priapism, i.e. prolong erection of the penis or clitoris that might result in permanent damage or nonfunctioning.   4. Diet: Regular   5. Supportive psychotherapy.   6. Smoking: Kevin Coffey was informed that it is very important that he quit smoking cigarettes and that there are various alternatives available to help with this difficult task, but first and foremost, he must make a firm commitment and decision to quit. The nature of marijuana addiction was discussed. The usefulness of behavioral therapy was discussed and suggested. Discussion of what purpose smoking Nicotine use serves and whether certain medications for anxiety and or depression, e.g., Bupropion, Lexapro, Effexor, Prozac, Zoloft, etc., and there cost (sometimes not covered fully by insurance) and side effects were reviewed. I recommend that he not allowing the potential costs of treatment to deter him from using psychiatric therapy  and or medication, because the long term economic and health benefits are obvious.  7. Insight oriented therapy  8. Group attendance/processing  9. Relapse prevention  10. Somatic Management   11. continue to build rapport.   12. Consultations Requested:  IP Medical for medical management -Routine  13. Medication changes/Orders as follows: Restart Depakote at 500 mg BID & Seroquel 100 mg qhs to treat mood instability  14. Lab Orders: Serum Ammonia, Magnesium, TSH, Lipid Profile, Hemoglobin A1C, RPR, Vitamins B12 & D levels  15. Appropriate disposition not finalized   16. Disposition  planning   17. Continue discharge planning - possibly on Monday to home if stable  18. Discharge Recommendations:   Family and Marital Therapy, [place] with possible follow up at [facility] or [facility]  ??  ???I have considered all of the patient-specific risk factors that increase (e.g. race, gender, family history, personal history of attempts, access to lethal means) and mitigate (e.g. strong family supports, positive coping skills, demographic features) suicide risk in this patient in formulating the patient???s current risk profile. This assessment will be revised based on Mr. Dracen A Deshpande???s clinical condition and any new personal or environmental variables that are discovered or arise.??? Mr. JENARO SOUDER is not endorsing suicidal ideation.  ??  I certify that this patient's inpatient psychiatric hospital services that were furnished since the previous certification were, and continue to be, required for treatment that could reasonably be expected to improve the patient's condition, and the need for diagnostic studies, medication adjustments, and the daily supervision, monitoring for abnormal psychological and social behavior of the patient that are indications of self-harm or harm of others are necessary in a highly structured inpatient psychiatric facility at this time. In addition, the hospital records will show that services furnished were required intensive treatment services related to the patient???s admission and or related services, or equivalent services. The services that provided are necessary until the Treatment Team, that consist of the patient's psychiatrist determined that the patient is no longer a self-threat or a threat to others and do not require inpatient psychiatric treatment and can be safely return to their previous living residence for mental health follow-up in the community.  ??  Deloris Ping, MD   09/12/2015 1:46  PM           _________________________________________________________________       Hospital Course:  Mr. AXZEL ROCKHILL was admitted to the psychiatric service of Dr. Electa Sniff, M.D. Physical safety and emotional support in the milieu maintained. Labs and vital signs were monitored closely.     Mr. ARMAAN POND was placed on close observation to prevent any harm to hisself or others. A discharge plan was initiated. A psychosocial consult and social work consult were completed. A number of issues and stressors were identified and discussed as well as reality orientation.     Mr. HERSHEL CORKERY was encouraged to attend OT, RT, medication education, Substance  abuse, and mental health counseling. He was encouraged to attend individual, group, and milieu therapy, as well as other therapeutic modalities.     Mr. VERDELL DYKMAN was started on an Opiate and Alcohol withdrawal Protocol to treat his current drug(s)/Nicotine withdrawal symptoms. Medication risks, benefits, and side effects profile were discussed. Marland Kitchen  He was informed of the Medication risks, benefits, and the side effects profile were discussed.       Mr. DOMINGOS RIGGI '  s  mental state and behavior during his admission did not shown any signs of him having a  major thought disorder, i.e., he was not  hyper-verbal, hypo-verbal, hypoactive, or hyperactive. His thought process never was tangential, circumstantial, or had any loose associations or flights of ideas. He did not have any thought disorganization, i.e. as evident having any disorganized speech that is nonspecific to the topic and questions asked.  He did not have alogia or poverty of content, thought blocking, loosening of association, clanging, or clang associations in his thinking.  His speech was without any word salad or perseveration of ideas.  He was not agitated, aggressive, displayed any heightened emotional arousal state, or increased motor activity.  He did not  have any bizarre thinking, delusions of any sort, or displayed any signs or symptoms indicating that he is at risk of harm to himself, others, or unable to maintain himself in the community at large without the need of a highly supervised, structured environment for self-protection and protection of the community during his admission.    Although, Mr. JAREK LONGTON continued to report feeling depressed, suicidal, homicidal, and paranoia during most of his admission, as well as stating having auditory and visual hallucinations, he never shown any symptoms or signs of such mental disturbances during his admission, i.e. responding to internal stimuli or self-talking.  He remained cooperative during his admission, communicated his concerns, wants, and wishes appropriately, voiced goal directed plans, and was present in the milieu with other patients daily holding conversations, smiling, interacting appropriately, and not displaying any sad, flat, blunt, elated, or abnormal affect or demonstrating responding to internal stimuli or self-talking.      Mr. BAYAN KUSHNIR  labs results do not show that he has any metabolic conditions to suspect an underline metabolic dysfunction for any of his reported psychiatric condition that needs immediate attention except for his polysubstance abuse.    Mr. RAUNAK ANTUNA sleep and appetite was very good during his admission and he did not miss a meal or have difficulty in going to sleep.      Mr. AADEN BUCKMAN continues to express realistic goal-oriented plans and is future oriented. His behavior has not been hyperactive or abnormally energetic.  He was  pleasant during his admission except when his needs were not met as he wished which resulted in him becoming intrusive, demanding, and impatient until he was satisfied, but he all ways avoided any confrontation from the staff that may result in loss of privileges or restriction of his movements. He never required  any redirection from the staff, restraining, or PRN medications for disorderly or threatening behavior during his admission.       Therefore, the treatment team felt that Mr. Dangelo Guzzetta Decarlo's behaviors was an indication that he no longer was a danger to his self or others and determined that he could safely be managed in the community and discharged from the hospital.     Objective:   Vital signs:  Blood pressure 107/60, pulse 74, temperature 97.9 ??F (36.6 ??C), resp. rate 20, height 5' 8"  (1.727 m), weight 63.5 kg (140 lb), SpO2 100 %.    No data found.    Lab/Data Review:  Results for VICTOR, LANGENBACH (MRN [id number]) as of 10/06/2015 01:00   Ref. Range 09/12/2015 07:00 09/13/2015 05:40 09/13/2015 06:00   Triglyceride Latest Ref Range: 0 - 200 MG/DL 77  84   Cholesterol, total Latest Ref Range: <200 MG/DL 136  139   HDL Cholesterol Latest Ref Range: 40 - 60 MG/DL 76 (H)  71 (H)   CHOL/HDL Ratio Latest Units:   1.8  2.0   LDL, calculated Latest Ref Range: 0 - 130 MG/DL 44.6  51.2   LDL/HDL Ratio Latest Units:   0.6  0.7   VLDL, calculated Latest Units: MG/DL 15.4  16.8   Vitamin B12 Latest Ref Range: 211 - 911 pg/mL   442   Hemoglobin A1c, (calculated) Latest Ref Range: 4.5 - 6.2 % 5.9  5.9   Ammonia Latest Ref Range: 11.0 - 32.0 UMOL/L  59 (H)    TSH Latest Ref Range: 0.55 - 7.78 uIU/mL 0.82     RPR Latest Ref Range: NR     NONREACTIVE     Discharge Diagnoses:   Axis I:     Patient Active Problem List   Diagnosis Code   ??? Essential hypertension I10   ??? Mild intermittent asthma without complication R51.88   ??? Cigarette nicotine dependence with withdrawal ([facility]) F17.213   ??? Marital relationship problem Z63.0   ??? Mood disorder ([facility]) F39   ??? Antisocial personality disorder in adult F60.2   ??? Hyperammonemia ([facility]) E72.20       Axis III:    Past Medical History:   Diagnosis Date   ??? Aggressive outburst    ??? Antisocial personality disorder in adult 09/13/2015   ??? Anxiety disorder    ??? Asthma    ??? Bipolar affective  disorder, current episode severe ([facility]) 09/12/2015   ??? Cigarette nicotine dependence with withdrawal ([facility]) 09/12/2015   ??? Depression    ??? Hyperammonemia ([facility]) 09/13/2015    Etiology and Chronicity Unknown    ??? Marital relationship problem 09/12/2015   ??? Mood disorder ([facility]) 09/13/2015   ??? Psychiatric disorder     Depression           No Known Allergies    Treatment and Response:  Good      Disposition:   Follow-Up:  Follow-up Information     Follow up With Details Comments Contact Info    None   None ([phone]) Patient stated that they have no PCP      Pt will return home  Pt will follow with [facility], [place] [address] [phone] [id number] [place] [address]            Discharged Condition:  Stable      Discharge medications reviewed with  Patient & appropriate educational materials and side effects teaching were provided.     Discharge Medications:  Discharge Medication List as of 09/15/2015  3:35 PM      START taking these medications    Details   lactulose (CHRONULAC) 10 gram/15 mL solution Take 15 mL by mouth every evening for 2 days. Indications: HYPERAMMONEMIA, Print, Disp-30 mL, R-0      therapeutic multivitamin (THERAGRAN) tablet Take 1 Tab by mouth daily for 30 days. Indications: VITAMIN DEFICIENCY PREVENTION, Print, Disp-30 Tab, R-0      divalproex ER (DEPAKOTE ER) 500 mg ER tablet Take 1 Tab by mouth daily for 30 days. Indications: DEPRESSION ASSOCIATED WITH BIPOLAR DISORDER, Print, Disp-30 Tab, R-0         CONTINUE these medications which have CHANGED    Details   QUEtiapine (SEROQUEL) 200 mg tablet Take 1 Tab by mouth nightly for 30 days. Indications: DEPRESSION TREATMENT ADJUNCT, GENERALIZED ANXIETY DISORDER, Print, Disp-30  Tab, R-0         STOP taking these medications       ALPRAZolam (XANAX) 0.5 mg tablet Comments:   Reason for Stopping:         divalproex DR (DEPAKOTE) 500 mg tablet Comments:   Reason for Stopping:               Competency  Statement:   At the current time, the patient is competent to make informed consent regarding their current medical care and discharge planning and/or financial decisions.      Copy of D/C summary to: None       Signed By: Deloris Ping, MD     October 06, 2015                           10/06/2015          1:00 AM

## 2015-09-15 NOTE — Discharge Summary (Signed)
Behavorial Health  Discharge Summary     Patient: Kevin Coffey MRN: [id number]  SSN: [id number]    Date of Birth: [birth year]  Age: 35 y.o.  Sex: male        Admit Date:    09/11/2015    Discharge date:   09/15/2015  2:20 PM    Attending Provider:   Deloris Ping, MD    Discharge Diagnoses:    Patient Active Problem List   Diagnosis Code   ??? Essential hypertension I10   ??? Mild intermittent asthma without complication A63.01   ??? Cigarette nicotine dependence with withdrawal ([place]) F17.213   ??? Marital relationship problem Z63.0   ??? Mood disorder ([place]) F39   ??? Antisocial personality disorder in adult F60.2   ??? Hyperammonemia ([place]) E72.20              Admission Psychiatry History and Physical  ??  09/12/2015  1:46 PM  ??  Admit Date: 09/11/2015 Attending: Deloris Ping, M.D.   ??  Date of Evaluation: 09/12/2015  ??  Subjective:   According to the [facility] ED note, [name] Is a 35 y.o. male [place] with h/o anxiety disorder and depression who presents to the ED with complaint of worsening depression and aggression. He states he has been "crying more frequently and throwing stuff in his house." Pt states he was released from prison and ran out of his medication last December. He has been noncompliant with his Depakote and Seroquel prescription since then. Pt denies fever, HA, N/V/D, AH/VH, SI/HI. Pt denies recent EtOH and illicit drug use.   ????  Patient is a 35 y.o. male presenting with mental health disorder. The history is provided by the patient.   Mental Health Problem   This is a new problem. The current episode started more than 1 week ago. The problem has not changed since onset.Associated symptoms include agitation and violence. Pertinent negatives include no weakness and no hallucinations. His past medical history is significant for depression.   _________________________________________________________________  ??  Information from the [facility] ED Care Management Notes: Pt is a 35y/o AAM who  was escorted to the ER by his wife c/o pt with S/I no plan. Pt presents calm, cooperative, alert, OX3 and despairing. Pt states he has been off his meds since Sept. (Depakote, Seroquel & Xanax) due to on/off reasons of going to jail in Nov and moving. Pt states lately he has been having violent episodes where the police have been called to his home for D/V. He states he is depressed and been threatening his wife. Pt states H/I towards his wife and reports having told his wife that yesterday he thought about how he was going to kill her and get rid of her body. SW requested number to contact pt's wife he states he does not have and that she is at work. SW will call number on face sheet as duty to warn. SW called [phone] and left V/M to wife's recording which had her name on voicemail. Pt states he just doesn't care anymore about life as it's hectic. 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 Pt denies hallucinations or any hx of. Pt denies SA and tox screens are negative but does admit to having used marijuana in the X. Pt reports being seen  in the ER yesterday as he states he fell and hurt himself. He reports health conditions of asthma. Pt had no other concerns.   ????  AXIS 1: Bipolar 1  AXIS 2: Deferred  AXIS 3: Asthma, recent fall  AXIS 4: noncompliance with mh tx, problems with primary  AXIS 5: 34  ????  SW consulted with Dr Laurann Montana pt is to be admitted for further evalutaton and stabilization. SW spoke with Elmyra Ricks from [facility]. Pt authorized for 5 days beginning today with a review on Tuesday, May 30th. Auth# [id number].   Cyril Mourning, MSW Care Management Signed ?? Progress Notes Date of Service: 09/11/15 2017   ??  Fanny Skates Nurse Admission Note: Pt is a [age]-year-old male who was admitted form the ED. On arrival to unit, pt was calm, but impatient  and irritable. He kept repeating that he was very depressed and that he has not taken his medication since September. Pt also stated, " I have a lot going on, a whole lot. I do not want to talk about it now. 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 Pt kept repeating that he has a lot going on, but will not elaborate. Pt also did not tell Probation officer that he was in prison. Pt just kept repeating, " They took all the information down stairs. What else do you want to know." However, pt took all his medications. He admitted to feeling of depression, but denies wanting to hurt self, or others. Pt denies any medical history, or drug used except marijuana. Pt admitted to smoking marijuana, but stated that he does not do it often. Tox screen was positive for marijuana. Writer educated pt on the rules and regulations of the unit.Pt is presently resting quietly in bed. Q 15 minutes checks maintained and pt made comfortable.  Marden Noble, RN Registered Nurse Signed NURSING Progress Notes Date of Service: 09/12/15 0110   ??  Past [facility] History:  Admit Date: 10/10/2013  Discharge date: 10/13/13  Discharge Diagnostic Impression  ????  Axis I: Bipolar Disorder, Manic Phase. Alcohol Use DO  ????  Axis II: none  Axis III:   ?? ?? ?? ??   Past Medical History   Diagnosis Date   ??? Psychiatric disorder ????   ???? ???? Depression   ??? Aggressive outburst ????   ??? Anxiety disorder ????   ??? Depression ????   ????  Axis IV: social  Axis V: 55  ????  Allergies: No Known Allergies   ????  Discharge medications:   No current facility-administered medications for this encounter.   ????  ?? ?? ??   Current Outpatient Prescriptions   Medication Sig   ??? divalproex DR (DEPAKOTE) 500 mg tablet Take 1 Tab by mouth two (2) times a day.   ??? QUEtiapine (SEROQUEL) 100 mg tablet Take 1 Tab by mouth two (2) times a day.   ????       Follow-up Information    ?? Comments Contact Info   ???? Pt will follow-up @ [facility] [address] [place], [place]. Appointment on October 17, 2013 @ 12:45pm with Dr. Maudie Mercury Pt will return to home @ [place] [place] with Family/ Phone [phone]   ??  Psych History:   Reports his first psychiatric admission was in September  of 2015 at [facility] and this is his only other admission. He denies attending following up appointment on discharge.   ??       Patient Active Problem List   ?? Diagnosis Date Noted   ??? Major depressive disorder 09/12/2015   ??? Essential hypertension 09/12/2015   ??? Alcohol abuse, continuous 09/12/2015   ??? Mild intermittent asthma without complication 14/78/2956   ??? Tobacco use 09/12/2015   ??? Marijuana use 09/12/2015   ??? Bipolar 1 disorder ([place]) 10/10/2013   ??  Psych Medication prior to Admission:   Medication Sig Start Date End Date   ALPRAZolam (XANAX) 0.5 mg tablet Take 0.5 mg by mouth two (2) times daily as needed for Anxiety. ???? ????   divalproex DR (DEPAKOTE) 500 mg tablet Take 1 Tab by mouth two (2) times a day. 10/13/13 Admits to not taking any medications since his last [facility] admission in September of 2016   QUEtiapine (SEROQUEL) 100 mg tablet Take 1 Tab by mouth two (2) times a day. 10/13/13 Admits to not taking any medications since his last [facility] admission in September of 2016   ??  Medical History:       Past Medical History:   Diagnosis Date   ??? Aggressive outburst ??   ??? Anxiety disorder ??   ??? Asthma ??   ??? Depression ??   ??? Psychiatric disorder ??   ?? Depression   ??  History reviewed. No pertinent surgical history.  No Known Allergies   ??  Social/Education/Work History:   Social History   ??        Social History   ??? Marital status: MARRIED x 7 years after 2 years of dating. He met his wife in [place] when she was studying to get her Master degree. His wife works at the same school that as he Nurse, adult.   ?? ?? Spouse name: N/A    ??? Number of children: None of his own but his Wife has 4 children, three girls, ages 18, 67 & 80 and a 67 year old son. He reports his 51 year old son is entitled and splits him and his wife with his father who he has been living with x 2.5 years. He states his son's father brought him a new BMW. He states his daughters stated they are trying to be neutral in the parenting splitting.    ??? Years of education: High School Grad., Cosmology Certification and Cabin crew Certification    ??  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 Reports loving his job.  ??  Family History:   Significant for HTN, DM, Cancer- bone, breast, cervical   Reports his mother is an alcoholic. He states his 65 year old brother has been a long term psychiatric resident.  ??  Legal History:   Reports on Probation now. States at the age of 56 after graduating from [employer] was arrested with a loaded gun and served 96 months of a 10 years sentence. His 2nd arrest was in March of 2016 for domestic violence. His 3rd arrest was on October 2016 to December 2016 for destruction of property, i.e. Breaking windows in their home. He reported having a total of 3 charges for Domestic violence, two of the charges dropped except for the last arrested.  ??  Substance Abuse History:  Social History   Substance Use Topics   ??? Smoking status: Current Every Day Smoker   ?? ?? Packs/day: 1.5 pack eday   ?? ?? Years: 20.00   ?? ?? Types: Cigarettes   ??? Smokeless tobacco: Never Used   ??? Alcohol use Yes    Reports stopped using Marijuana in September of 2016  ??  ??  Objective:   Vital signs: Blood pressure 101/61, pulse 62, temperature 98.4 ??F (36.9 ??C), resp. rate 18, height '5\' 8"'$  (1.727 m), weight 63.5 kg (140 lb), SpO2 98 %.Patient Vitals for the past 8 hrs:  ?? BP Temp Pulse Resp   09/12/15 0830 101/61 98.4 ??F (36.9 ??C) 62 18   ??  Last test results and Reviewed in last 24 hours:    Recent Results          Recent Results (from the past 24 hour(s))   METABOLIC PANEL, COMPREHENSIVE   ?? Collection Time: 09/11/15 5:08 PM   Result Value Ref Range   ?? Sodium 142 136 - 145 mmol/L   ?? Potassium 4.1 3.50 - 5.10 mmol/L   ?? Chloride 104 98 - 107 mmol/L   ?? CO2 27 21 - 32 mmol/L   ?? Anion gap 15 10 - 17 mmol/L   ?? Glucose 75 74 - 106 mg/dL   ?? BUN 21 (H) 7 - 18 MG/DL   ?? Creatinine 0.92 0.6 - 1.3 MG/DL   ?? BUN/Creatinine ratio 23 (H) 6.0 - 20.0    ?? GFR est AA >60 >60 ml/min/1.83m   ?? GFR est non-AA >60 >60 ml/min/1.743m  ?? Calcium 8.9 8.5 - 10.1 MG/DL   ?? Bilirubin, total 0.5 0.20 - 1.00 MG/DL   ?? ALT (SGPT) 25 12.0 - 78.0 U/L   ?? AST (SGOT) 22 15 - 37 U/L   ?? Alk. phosphatase 73 46 - 116 U/L   ?? Protein, total 7.6 6.40 - 8.20 g/dL   ?? Albumin 3.9 3.40 - 5.00 g/dL   ?? Globulin 3.7 g/dL   ?? A-G Ratio 1.1 1.0 - 3.1    CBC WITH AUTOMATED DIFF   ?? Collection Time: 09/11/15 5:08 PM   Result Value Ref Range   ?? WBC 3.5 (L) 4.8 - 10.8 K/uL   ?? RBC 4.94 4.0 - 5.2 M/uL   ?? HGB 13.7 12.8 - 15.0 g/dL   ?? HCT 38.9 36.8 - 45.2 %   ?? MCV 78.7 (L) 81 - 99 FL   ?? MCH 27.7 27 - 31 PG   ?? MCHC 35.2 32 - 36 g/dL   ?? RDW 15.1 (H) 11.5 - 14.5 %   ?? PLATELET 187 140 - 450 K/uL   ?? MPV 9.9 7.4 - 10.4 FL   ?? NEUTROPHILS 42 40 - 70 %   ?? LYMPHOCYTES 51 (H) 16 - 40 %   ?? MONOCYTES 6 0 - 12 %   ?? EOSINOPHILS 1 0 - 5 %   ?? BASOPHILS 0 0 - 2 %   ?? ABS. NEUTROPHILS 1.5 1.4 - 6.5 K/UL   ?? ABS. LYMPHOCYTES 1.8 1.2 - 3.4 K/UL   ?? ABS. MONOCYTES 0.2 (L) 1.1 - 3.2 K/UL   ?? ABS. EOSINOPHILS 0.0 0.0 - 0.7 K/UL   ?? ABS. BASOPHILS 0.0 0.0 - 0.2 K/UL   ?? DF AUTOMATED ????   ?? NRBC 0.0 0 PER 100 WBC   ?? IMMATURE GRANULOCYTES 0.3 0.0 - 5.0 %   ETHYL ALCOHOL   ??  Collection Time: 09/11/15 5:08 PM   Result Value Ref Range   ?? ALCOHOL(ETHYL),SERUM <3 <3.0 MG/DL   HEMOGLOBIN A1C W/O EAG   ?? Collection Time: 09/12/15 7:00 AM   Result Value Ref Range   ?? Hemoglobin A1c 5.9 4.5 - 6.2 %   LIPID PANEL   ?? Collection Time: 09/12/15 7:00 AM    Result Value Ref Range   ?? LIPID PROFILE ???? ????   ?? Cholesterol, total 136 <200 MG/DL   ?? Triglyceride 77 0 - 200 MG/DL   ?? HDL Cholesterol 76 (H) 40 - 60 MG/DL   ?? LDL, calculated 44.6 0 - 130 MG/DL   ?? VLDL, calculated 15.4 MG/DL   ?? CHOL/HDL Ratio 1.8 ????   ?? LDL/HDL Ratio 0.6 ????   TSH 3RD GENERATION   ?? Collection Time: 09/12/15 7:00 AM   Result Value Ref Range   ?? TSH 0.82 0.55 - 7.78 uIU/mL      ??  ????  ??  Current Facility-Administered Medications:   ??? nicotine (NICORETTE) gum 4 mg, 4 mg, Oral, Q2H PRN, Razel L Frey, NP  ??? albuterol-ipratropium (DUO-NEB) 2.5 MG-0.5 MG/3 ML, 3 mL, Nebulization, Q6H PRN, Razel L Frey, NP  ??? alum-mag hydroxide-simeth (MYLANTA) oral suspension 30 mL, 30 mL, Oral, TID PRN, Deloris Ping, MD  ??? acetaminophen (TYLENOL) tablet 1,000 mg, 1,000 mg, Oral, Q6H PRN, Deloris Ping, MD  ??? benzocaine-menthol (CEPACOL) lozenge 1 Lozenge, 1 Lozenge, Mucous Membrane, PRN, Deloris Ping, MD  ??? bisacodyl (DULCOLAX) tablet 5 mg, 5 mg, Oral, DAILY PRN, Deloris Ping, MD  ??? chlorhexidine (PERIDEX) 0.12 % mouthwash 10 mL, 10 mL, Oral, BID PRN, Deloris Ping, MD  ??? dicyclomine (BENTYL) capsule 10 mg, 10 mg, Oral, QID PRN, Deloris Ping, MD  ??? magnesium hydroxide (MILK OF MAGNESIA) 400 mg/5 mL oral suspension 30 mL, 30 mL, Oral, QHS PRN, Deloris Ping, MD  ??? ketoconazole (NIZORAL) 2 % cream, , Topical, BID PRN, Deloris Ping, MD  ??? ondansetron (ZOFRAN ODT) tablet 4 mg, 4 mg, Oral, Q6H PRN, Deloris Ping, MD  ??? [START ON 09/13/2015] therapeutic multivitamin (THERAGRAN) tablet 1 Tab, 1 Tab, Oral, DAILY, Deloris Ping, MD  ??? Thiamine Mononitrate (B-1) tablet 100 mg, 100 mg, Oral, NOW, Deloris Ping, MD  ??? white petrolatum (VASELINE) ointment, , Topical, PRN, Deloris Ping, MD  ??? guaiFENesin (ROBITUSSIN) 100 mg/5 mL oral liquid 400 mg, 400 mg, Oral, Q4H PRN, Deloris Ping, MD  ??? [START ON 1/61/0960] folic acid (FOLVITE) tablet 1 mg, 1 mg, Oral, DAILY, Deloris Ping, MD   ??? famotidine (PEPCID) tablet 20 mg, 20 mg, Oral, BID PRN, Deloris Ping, MD  ??? diphenoxylate-atropine (LOMOTIL) tablet 1 Tab, 1 Tab, Oral, QID PRN, Deloris Ping, MD  ??? diphenhydrAMINE (BENADRYL) capsule 100 mg, 100 mg, Oral, QHS PRN, Deloris Ping, MD  ??? LORazepam (ATIVAN) tablet 2 mg, 2 mg, Oral, Q4H PRN, Deloris Ping, MD  ??? OLANZapine (ZyPREXA zydis) disintegrating tablet 5 mg, 5 mg, Oral, Q6H PRN, Deloris Ping, MD  ??? diphenhydrAMINE (BENADRYL) capsule 50 mg, 50 mg, Oral, Q6H PRN, Deloris Ping, MD  ??? QUEtiapine (SEROquel) tablet 200 mg, 200 mg, Oral, TID, Deloris Ping, MD  ??? divalproex DR (DEPAKOTE) tablet 500 mg, 500 mg, Oral, TID, Deloris Ping, MD, 500 mg at 09/12/15 1000  ??? hydrOXYzine pamoate (VISTARIL) capsule 50 mg, 50 mg, Oral, Q6H PRN, Deloris Ping, MD, 50 mg at 09/12/15 1000  ??? cloNIDine HCl (CATAPRES) tablet 0.1 mg, 0.1 mg, Oral, Q4H PRN, Deloris Ping,  MD, 0.1 mg at 09/12/15 1000  ??  Physical Exam: IP Medical Consult Exam:  ??  Date/Time: 09/12/2015 12:21 PM  ????  Subjective:   REQUESTING PHYSICIAN: Dr. Laurann Montana  ??  REASON FOR CONSULT: Medical Management   Blanca is a 35 y.o. African American male who I was asked to see for medical management. Pt a/o x3. Pt able to answer simple questions and follows commands. 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 Pt begins to raise his voice when he discusses the psych unit and feels it is "disorganized" and he feels like he has no control. Denies SI, HI and hallucinations. Denies SOB, CP, palpitations, N/V/D.  ????  REVIEW OF SYSTEMS:   All other systems negative on 10 system reviewed are normal except for as stated in HPI.  ????  ?? ?? ??   Past Medical History:   Diagnosis Date   ??? Aggressive outburst ????   ??? Anxiety disorder ????   ??? Asthma ????   ??? Depression ????   ??? Psychiatric disorder ????   ???? Depression   ????  ????  History reviewed. No pertinent surgical history.  ????  ????  SOCIAL HISTORY:       ?? Social History   ??      ????  ?? ?? ?? ??   Social History   ??? Marital status: MARRIED   ???? ???? Spouse name: N/A   ??? Number of children: N/A   ??? Years of education: N/A   ????  ?? ?? ?? ?? ??   Social History Main Topics   ??? Smoking status: Current Every Day Smoker   ???? ???? Packs/day: 0.50   ???? ???? Years: 20.00   ???? ???? Types: Cigarettes   ??? Smokeless tobacco: Never Used   ??? Alcohol use Yes ??   ??? Drug use: 1.00 per week   ???? ???? Special: Marijuana   ??? Sexual activity: Yes   ???? ???? Partners: Female   ????  ?? ?? ??   Other Topics Concern   ??? None   ????   ????  ????  Family History: Significant for HTN, DM, Cancer- bone, breast, cervical    ????  No Known Allergies   ????  ?? ?? ?? ?? ?? ??   Prior to Admission medications    Medication Sig Start Date End Date Taking? Authorizing Provider   ALPRAZolam Duanne Moron) 0.5 mg tablet Take 0.5 mg by mouth two (2) times daily as needed for Anxiety. ???? ???? ???? Phys Other, MD   divalproex DR (DEPAKOTE) 500 mg tablet Take 1 Tab by mouth two (2) times a day. 10/13/13 ???? ???? Nell Range, MD   QUEtiapine (SEROQUEL) 100 mg tablet Take 1 Tab by mouth two (2) times a day. 10/13/13 ???? ???? Nell Range, MD   ????  ????  ????  ????  Objective:   VITALS:   ?? ?? ??   Visit Vitals   ??? BP 101/61 (BP Patient Position: At rest)   ??? Pulse 62   ??? Temp 98.4 ??F (36.9 ??C)   ??? Resp 18   ??? Ht '5\' 8"'$  (1.727 m)   ??? Wt 63.5 kg (140 lb)   ??? SpO2 98%   ??? BMI 21.29 kg/m2   ????  Temp (24hrs), Avg:98.4 ??F (36.9 ??C), Min:97.9 ??F (36.6 ??C), Max:99 ??F (37.2 ??C)  ????  ????  PHYSICAL EXAM:   ????  General:  HEENT:  No acute distress. Appears stated age.  Neck supple. Trachea midline. PEERLA, no icterus.   Lungs:  ???? Clear to auscultation bilaterally. No wheezing/rales/rhonchi. No accessory muscle use. No tachypnea.   Chest wall:  No tenderness or deformity.   Heart:  Regular rate and rhythm, S1, S2 normal, no murmurs, clicks, rubs or gallops. No JVD. No carotid bruits.    Abdomen:  Soft, non-tender/non-distended. Bowel sounds normal. No masses, No organomegaly. No guarding or rigidity    Extremities: Extremities normal, atraumatic, no cyanosis or edema.   Pulses: 2+ and symmetric all extremities.   Skin: Skin color, texture, turgor normal. No rashes or lesions   Neurologic:  ???? CNII-XII intact. No focal motor or sensory deficits appreciated. AAOx3   ????  LAB DATA REVIEWED:   ?? ??   Recent Labs   ???? 09/11/15  1708   WBC 3.5*   HGB 13.7   HCT 38.9   PLT 187   ????  ?? ??   Recent Labs   ???? 09/11/15  1708   NA 142   K 4.1   CL 104   CO2 27   GLU 75   BUN 21*   CREA 0.92   CA 8.9   ALB 3.9   SGOT 22   ALT 25   ????  No results for input(s): PH, PCO2, PO2, HCO3, FIO2 in the last 72 hours.  ????  IMAGING RESULTS:  No results found.  ????  REVIEW OF PREVIOUS RECORDS:  ????  Recommendations/Plan:   ????  Assessment  ????                  Hospital Problems  Date Reviewed: 10-07-15      ?? ???? ???? ???? Codes Class Noted POA      ???? * (Principal)Bipolar 1 disorder ([place]) ICD-10-CM: F31.9  ICD-9-CM: 296.7 ???? 10/10/2013 Yes   ???? ????      ???? Major depressive disorder ICD-10-CM: F32.9  ICD-9-CM: 296.20 ???? 10-07-2015 Yes   ???? ????      ???? Essential hypertension ICD-10-CM: I10  ICD-9-CM: 401.9 ???? 10/07/15 Yes   ???? ????      ???? Alcohol abuse, continuous ICD-10-CM: F10.10  ICD-9-CM: 305.01 ???? 07-Oct-2015 Yes   ???? ????      ???? Mild intermittent asthma without complication ZOX-09-UE: A54.09  ICD-9-CM: 493.90 ???? 10-07-2015 Yes   ???? ????      ???? Tobacco use ICD-10-CM: Z72.0  ICD-9-CM: 305.1 ???? Oct 07, 2015 Yes   ???? ????      ???? Marijuana use ICD-10-CM: F12.10  ICD-9-CM: 305.20 ???? 2015/10/07 Yes   ???? ????      ??                ????   ????  ___________________________________________________  PLAN/RECOMMENDATIONS:   1. All above psych diagnoses to be managed by attending psych team.  2. HTN- pt has history of hypertension, but is well controlled without medication outpatient. F/u vitals.   3. Asthma- no acute exacerbation. PRN duonebs. F/u pulse ox.   4. Alcohol abuse- to be managed by psych team. Pt counseled to stop drinking.    5. Tobacco use- pt counseled to stop smoking. Nicorette gum PRN.   6. Marijuana abuse- to be managed by psych team. Pt counseled to stop marijuana abuse.   7. Pt to follow up outpatient with PCP.  ????  ????  Will sign off at this time. Please call with questions.  ????  Total Time : 35 minutes   ??  Hospitalist: Trixie Dredge, NP - 09/12/2015  _________________________________________________________________  ??  [facility] Psychiatric Admission Evaluation: I met with Mr. KYRIN GRATZ today and asked what is his understanding for the reason for his admission to the hospital. Ms. LUCAS WINOGRAD stated that "I needed to get back on my medicine. 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 My wife gave me an ultimatum to coming into the hospital and get back on my medicine if I loved her and I decided to come to the ED. When they asked if I had thoughts of doing self-harm I stated I did but I never said I had any plans or intentions because I love my wife and children and I will not kill myself. I did not state I was homicidal or hearing voices. I just want to get back on my medicines and go home and back to work ASAP." Mr. Lanuza  denies having auditory hallucinations and have not display symptoms or signs of responding to internal stimuli or self-talking. He denies feelings of hopelessness, helplessness, or worthlessness and denies having any suicidal, homicidal, or paranoid ideations. He admits to having racing thoughts, irritable and angry mood but denies having any thoughts or intentions of doing self-harm or harm to anyone.  ??  Mental Status Exam On Admission:  Appearance and ??General Behavior  General appearance:??   Gait  Connection with Provider  Clothing:??????????????????????????????  Eye Contact  Attention Span  Psychomotor Activity  Abnormal Movements?? ??  WDWN and well kept  Stable  Good ??   hospital   WNL  Good????????   None   None   Speech form and content, Language/Associations  Speech rate:  Speech rhythm:  Volume:  Tone/Prosody:  Dysarthria:  Form of Thought ??Disorder:  Pressured Speech:??????  Thought process: ??  Normal??& Spontaneous   Normal  Normal  Curt & Tense sometimes  None  ??  None  Clear, Coherent, Goal Directed, future oriented, Organized, No distortions, No Flight of Ideas &?? informative??and logical, no circumstantiality or tangential thinnking   Stated mood:  Objectively appears:   Affect  Affect is:   Facial Expression:????  Vital Sense (Physical well-being):??????????  Self-Attitude  SI/HI/PDW ??  Hopefulness for the future:  Passive death wish:  Thoughts/Intent/Plan to harm self:  Thoughts/Intent/Plan to harm others: Alright but regretful for stopping medications  appropriate to circumstances and Concerns  Constricted  Congruent with stated Mood???? ??  Irritable Expressive gestures ??   Good  Good  ??  Good  denied??  No intention and strongly denies  No intention and strongly denies   Abnormal Perceptions: Illusions / Hallucinations:  denied??& Not responding to any internal stimuli??   Delusions ?? None???? ??   Anxiety ?? Obsessions Behavior:??None;??Obsessive Thoughts:??None; ??  Complaints of Anxiety: None   Alertness ??and ??Orientation Alert and Oriented to person, time, and place?????????????????????????????????????????????????????????????????????????????????? ??   Judgment & Insight ?? Situation testing: Good; Judgment: Fair ; Insight:Good    Impression:    Axis I:          Patient Active Problem List   Diagnosis Code   ??? Essential hypertension I10   ??? Mild intermittent asthma without complication K16.01   ??? Cigarette nicotine dependence with withdrawal ([facility]) F17.213   ??? Bipolar affective disorder, current episode severe ([place]) F31.9   ???  Marital relationship problem Z63.0   ??  Axis II:  Deferred  ??  Axis III:         Past Medical History:   Diagnosis Date   ??? Aggressive outburst ??   ??? Anxiety disorder ??   ??? Asthma ??   ??? Depression ??    ??? Psychiatric disorder ??   ?? Depression      No Known Allergies  ??  Axis IV:  Acute: Problems with primary support group   Problems related to social environment,   Other psychosocial or environmental problems   Problems with Legal System  Chronic Psychiatric Problems  Chronic Noncompliance with Psychiatric Treatment   ????  Axis V: Current -   21-30; Past Year: 67-50   ????  Plan:   ??  Competency Statement:   At the current time, the patient is competent to make informed consent regarding their current medical care and discharge planning and/or financial decisions.  ??  Recommendations for Treatment/Conditions:  Psychiatric Inpatient treatment on [facility] is recommended while in hospital  Outpatient follow up recommended after release  ????  THERAPEUTIC GOALS AND PLANS:  ??  THERAPEUTIC GOALS:  1. Mr. JEDI CATALFAMO with actively participate in discharge planning.   2. Mr. CLAVIN RUHLMAN will be compliant in taking all prescribed psychiatric medications  3. Mr. LEOVARDO THOMAN will have a decrease intensity of his Depressed & Suicidal Symptoms, i.e., self-isolations, depress/sad mood, responding to internal stimuli, inappropriate behavior, poor and intrusive boundaries, agitation, aggression, paranoia.  4. Mr. SHAHZAD THOMANN will attend and participate in Community/milieu activities.   5. Mr. JOMARI BARTNIK will be visible and interact with peers and staff appropriately in the milieu.  ??  PLANS:  ??  Continue inpatient psychiatric hospitalization on level: Routine precautions  1. Continue current medications,discussed medication risk benefit profile and potential side effects.  2. Medication reconciliation completed with the patient and within the  EMR.   3. Medication education, i.e. Discussion of medication risk and benefits; Patient was informed of the following medication risk and side effects, abnormal and painful muscle tightness and spasm, involuntary muscle  movements, e.g. lip smacking, tongue and hand trembling, sedation, dryness of eyes, mouth, and nose, difficulty in passing urine and or stool (constipation), problems of ejaculation, orgasm, and erection, seizures, excessive or decrease in appetite, weight, or sexual interest, risk of heart disease, strokes, hypertension, and hypercholesterolemia that may result in early death if labs and medication are not monitored regularly, i.e. monthly or every 3 months, a chance of  emotional instability, e.g. mania, depression, suicidal thoughts or behavior, and risk of priapism, i.e. prolong erection of the penis or clitoris that might result in permanent damage or nonfunctioning.   4. Diet: Regular   5. Supportive psychotherapy.   6. Smoking: Mr. ANDRIUS ANDREPONT was informed that it is very important that he quit smoking cigarettes and that there are various alternatives available to help with this difficult task, but first and foremost, he must make a firm commitment and decision to quit. The nature of marijuana addiction was discussed. The usefulness of behavioral therapy was discussed and suggested. Discussion of what purpose smoking Nicotine use serves and whether certain medications for anxiety and or depression, e.g., Bupropion, Lexapro, Effexor, Prozac, Zoloft, etc., and there cost (sometimes not covered fully by insurance) and side effects were reviewed. I recommend that he not allowing the potential costs of treatment to deter him from using psychiatric therapy  and or medication, because the long term economic and health benefits are obvious.  7. Insight oriented therapy  8. Group attendance/processing  9. Relapse prevention  10. Somatic Management   11. continue to build rapport.   12. Consultations Requested:  IP Medical for medical management -Routine  13. Medication changes/Orders as follows: Restart Depakote at 500 mg BID & Seroquel 100 mg qhs to treat mood instability   14. Lab Orders: Serum Ammonia, Magnesium, TSH, Lipid Profile, Hemoglobin A1C, RPR, Vitamins B12 & D levels  15. Appropriate disposition not finalized   16. Disposition planning   17. Continue discharge planning - possibly on Monday to home if stable  18. Discharge Recommendations:   Family and Marital Therapy, [place] with possible follow up at [facility] or [facility]  ??  ???I have considered all of the patient-specific risk factors that increase (e.g. race, gender, family history, personal history of attempts, access to lethal means) and mitigate (e.g. strong family supports, positive coping skills, demographic features) suicide risk in this patient in formulating the patient???s current risk profile. This assessment will be revised based on Mr. Marquies A Borello???s clinical condition and any new personal or environmental variables that are discovered or arise.??? Mr. GRAYLEN NOBOA is not endorsing suicidal ideation.  ??  I certify that this patient's inpatient psychiatric hospital services that were furnished since the previous certification were, and continue to be, required for treatment that could reasonably be expected to improve the patient's condition, and the need for diagnostic studies, medication adjustments, and the daily supervision, monitoring for abnormal psychological and social behavior of the patient that are indications of self-harm or harm of others are necessary in a highly structured inpatient psychiatric facility at this time. In addition, the hospital records will show that services furnished were required intensive treatment services related to the patient???s admission and or related services, or equivalent services. The services that provided are necessary until the Treatment Team, that consist of the patient's psychiatrist determined that the patient is no longer a self-threat or a threat to others and do not  require inpatient psychiatric treatment and can be safely return to their previous living residence for mental health follow-up in the community.  ??  Deloris Ping, MD   09/12/2015 1:46 PM           _________________________________________________________________       Hospital Course:  Mr. TANNER YELEY was admitted to the psychiatric service of Dr. Electa Sniff, M.D. Physical safety and emotional support in the milieu maintained. Labs and vital signs were monitored closely.     Mr. RIDGELY ANASTACIO was placed on close observation to prevent any harm to hisself or others. A discharge plan was initiated. A psychosocial consult and social work consult were completed. A number of issues and stressors were identified and discussed as well as reality orientation.     Mr. TERI DILTZ was encouraged to attend OT, RT, medication education, Substance  abuse, and mental health counseling. He was encouraged to attend individual, group, and milieu therapy, as well as other therapeutic modalities.     Mr. JAYE POLIDORI was started on an Opiate and Alcohol withdrawal Protocol to treat his current drug(s)/Nicotine withdrawal symptoms. Medication risks, benefits, and side effects profile were discussed. Marland Kitchen  He was informed of the Medication risks, benefits, and the side effects profile were discussed.       Mr. ELBRIDGE MAGOWAN '  s  mental state and behavior during his admission did not shown any signs of him having a  major thought disorder, i.e., he was not  hyper-verbal, hypo-verbal, hypoactive, or hyperactive. His thought process never was tangential, circumstantial, or had any loose associations or flights of ideas. He did not have any thought disorganization, i.e. as evident having any disorganized speech that is nonspecific to the topic and questions asked.  He did not have alogia or poverty of content, thought blocking, loosening of association, clanging,  or clang associations in his thinking.  His speech was without any word salad or perseveration of ideas.  He was not agitated, aggressive, displayed any heightened emotional arousal state, or increased motor activity.  He did not have any bizarre thinking, delusions of any sort, or displayed any signs or symptoms indicating that he is at risk of harm to himself, others, or unable to maintain himself in the community at large without the need of a highly supervised, structured environment for self-protection and protection of the community during his admission.    Although, Mr. XABI WITTLER continued to report feeling depressed, suicidal, homicidal, and paranoia during most of his admission, as well as stating having auditory and visual hallucinations, he never shown any symptoms or signs of such mental disturbances during his admission, i.e. responding to internal stimuli or self-talking.  He remained cooperative during his admission, communicated his concerns, wants, and wishes appropriately, voiced goal directed plans, and was present in the milieu with other patients daily holding conversations, smiling, interacting appropriately, and not displaying any sad, flat, blunt, elated, or abnormal affect or demonstrating responding to internal stimuli or self-talking.      Mr. LATOYA DISKIN  labs results do not show that he has any metabolic conditions to suspect an underline metabolic dysfunction for any of his reported psychiatric condition that needs immediate attention except for his polysubstance abuse.    Mr. KEVONTAE BURGOON sleep and appetite was very good during his admission and he did not miss a meal or have difficulty in going to sleep.      Mr. KASSIUS BATTISTE continues to express realistic goal-oriented plans and is future oriented. His behavior has not been hyperactive or abnormally energetic.  He was  pleasant during his admission except when  his needs were not met as he wished which resulted in him becoming intrusive, demanding, and impatient until he was satisfied, but he all ways avoided any confrontation from the staff that may result in loss of privileges or restriction of his movements. He never required any redirection from the staff, restraining, or PRN medications for disorderly or threatening behavior during his admission.       Therefore, the treatment team felt that Mr. Ronnel Zuercher Rochon's behaviors was an indication that he no longer was a danger to his self or others and determined that he could safely be managed in the community and discharged from the hospital.     Objective:   Vital signs:  Blood pressure 107/60, pulse 74, temperature 97.9 ??F (36.6 ??C), resp. rate 20, height '5\' 8"'$  (1.727 m), weight 63.5 kg (140 lb), SpO2 100 %.    No data found.    Lab/Data Review:  Results for MAGDIEL, BARTLES (MRN [id number]) as of 10/06/2015 01:00   Ref. Range 09/12/2015 07:00 09/13/2015 05:40 09/13/2015 06:00   Triglyceride Latest Ref Range: 0 - 200 MG/DL 77  84   Cholesterol, total Latest Ref Range: <200 MG/DL 136  139   HDL Cholesterol Latest Ref Range: 40 - 60 MG/DL 76 (H)  71 (H)   CHOL/HDL Ratio Latest Units:   1.8  2.0   LDL, calculated Latest Ref Range: 0 - 130 MG/DL 44.6  51.2   LDL/HDL Ratio Latest Units:   0.6  0.7   VLDL, calculated Latest Units: MG/DL 15.4  16.8   Vitamin B12 Latest Ref Range: 211 - 911 pg/mL   442   Hemoglobin A1c, (calculated) Latest Ref Range: 4.5 - 6.2 % 5.9  5.9   Ammonia Latest Ref Range: 11.0 - 32.0 UMOL/L  59 (H)    TSH Latest Ref Range: 0.55 - 7.78 uIU/mL 0.82     RPR Latest Ref Range: NR     NONREACTIVE     Discharge Diagnoses:   Axis I:     Patient Active Problem List   Diagnosis Code   ??? Essential hypertension I10   ??? Mild intermittent asthma without complication Z61.09   ??? Cigarette nicotine dependence with withdrawal ([facility]) F17.213   ??? Marital relationship problem Z63.0   ??? Mood disorder (HCC) F39    ??? Antisocial personality disorder in adult F60.2   ??? Hyperammonemia (HCC) E72.20       Axis III:    Past Medical History:   Diagnosis Date   ??? Aggressive outburst    ??? Antisocial personality disorder in adult 09/13/2015   ??? Anxiety disorder    ??? Asthma    ??? Bipolar affective disorder, current episode severe ([facility]) 09/12/2015   ??? Cigarette nicotine dependence with withdrawal ([facility]) 09/12/2015   ??? Depression    ??? Hyperammonemia ([facility]) 09/13/2015    Etiology and Chronicity Unknown    ??? Marital relationship problem 09/12/2015   ??? Mood disorder ([place]) 09/13/2015   ??? Psychiatric disorder     Depression           No Known Allergies    Treatment and Response:  Good      Disposition:   Follow-Up:  Follow-up Information     Follow up With Details Comments Contact Info    None   None (395) Patient stated that they have no PCP      Pt will return home  Pt will follow with [facility] at [place], [place] [address] [phone] [address] [place] [address]            Discharged Condition:  Stable      Discharge medications reviewed with  Patient & appropriate educational materials and side effects teaching were provided.     Discharge Medications:  Discharge Medication List as of 09/15/2015  3:35 PM      START taking these medications    Details   lactulose (CHRONULAC) 10 gram/15 mL solution Take 15 mL by mouth every evening for 2 days. Indications: HYPERAMMONEMIA, Print, Disp-30 mL, R-0      therapeutic multivitamin (THERAGRAN) tablet Take 1 Tab by mouth daily for 30 days. Indications: VITAMIN DEFICIENCY PREVENTION, Print, Disp-30 Tab, R-0      divalproex ER (DEPAKOTE ER) 500 mg ER tablet Take 1 Tab by mouth daily for 30 days. Indications: DEPRESSION ASSOCIATED WITH BIPOLAR DISORDER, Print, Disp-30 Tab, R-0         CONTINUE these medications which have CHANGED    Details   QUEtiapine (SEROQUEL) 200 mg tablet Take 1 Tab by mouth nightly for 30  days. Indications: DEPRESSION TREATMENT ADJUNCT, GENERALIZED ANXIETY DISORDER, Print,  Disp-30 Tab, R-0         STOP taking these medications       ALPRAZolam (XANAX) 0.5 mg tablet Comments:   Reason for Stopping:         divalproex DR (DEPAKOTE) 500 mg tablet Comments:   Reason for Stopping:               Competency Statement:   At the current time, the patient is competent to make informed consent regarding their current medical care and discharge planning and/or financial decisions.      Copy of D/C summary to: None       Signed By: Deloris Ping, MD     October 06, 2015                           10/06/2015          1:00 AM

## 2015-09-15 NOTE — Progress Notes (Signed)
Problem: Suicide/Homicide (Adult/Pediatric)  Goal: *STG: Remains safe in hospital  Outcome: Progressing Towards Goal  Remained safe in the hospital  Goal: *STG: Seeks staff when feelings of self harm or harm towards others arise  Outcome: Progressing Towards Goal  Verbally contracted for safety.  Goal: *STG: Attends activities and groups  Outcome: Progressing Towards Goal  Actively attends groups this shift  Goal: *STG/LTG: Complies with medication therapy  Outcome: Progressing Towards Goal  Complies with medications this shift  Goal: *STG/LTG: No longer expresses self destructive or suicidal/homicidal thoughts  Outcome: Progressing Towards Goal  Denies SI/HI and A/V hallucinations and delusions    Problem: Falls - Risk of  Goal: *Absence of falls  Outcome: Progressing Towards Goal  Absent falls this shift
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
## 2015-09-15 NOTE — Progress Notes (Signed)
Problem: Suicide/Homicide (Adult/Pediatric)  Goal: *STG: Remains safe in hospital  Outcome: Progressing Towards Goal  Pt remains safe in the hospital as he's maintained on q 15 minutes safety checks and nurse hourly rounds  Goal: *STG: Seeks staff when feelings of self harm or harm towards others arise  Outcome: Progressing Towards Goal  Pt knows to come to staff when feelings of self harm or harm towards others arise.  Goal: *STG: Attends activities and groups  Outcome: Progressing Towards Goal  Pt was out all and about in the milieu. Attended the unit activities and groups.  Goal: *STG/LTG: Complies with medication therapy  Outcome: Progressing Towards Goal  Pt is medication compliant. Took all scheduled medications as ordered.  Goal: *STG/LTG: No longer expresses self destructive or suicidal/homicidal thoughts  Outcome: Progressing Towards Goal  Denies SI/Hi on this shift    Problem: Falls - Risk of  Goal: *Absence of falls  Outcome: Progressing Towards Goal  Pt was fall-free on this shift. No noted or reported fall  Goal: *Knowledge of fall prevention  Outcome: Progressing Towards Goal  Pt has knowledge of fall prevention. Had on his non-skid socks the entire shift.

## 2015-09-15 NOTE — Progress Notes (Signed)
Type of Service: Pt was provided 3 bus tokens to his address.

## 2015-09-15 NOTE — Progress Notes (Signed)
09/15/15 1404   Attitude and Behavior   General Attitude Cooperative   Affect Blunted   Mood Euthymic   Insight Fair   Judgement Intact   Memory  Intact   Thought Content Unremarkable   Hallucinations None   Delusions Paranoid   Concentration Fair   Speech Pattern Unremarkable   Thought Process Unremarkable   Motor Activity Unremarkable   Patient alert and oriented times 3. Visible in the milieu with peer interaction noted. No aggressive behavior or irritability noted. Patient seems cooperative, friendly and attentive. Hygiene and grooming seem fair. Patient focused on discharge today. Compliant with medications this shift. Discharge orders noted. Completed discharge database. Reviewed follow up instructions. Patient verbalized understanding. Patient discharged at 1620

## 2015-09-15 NOTE — Progress Notes (Signed)
09/14/15 1940   Attitude and Behavior   General Attitude Cooperative   Affect Blunted   Mood Euthymic   Insight Fair   Judgement Impaired   Memory  Intact   Thought Content Unremarkable   Hallucinations None   Delusions Paranoid   Concentration Fair   Speech Pattern Unremarkable   Thought Process Unremarkable   Motor Activity Unremarkable   Pt presents cooperative with a blunted affect. Very visible in the milieu and interacting appropriately with select peers and staff. Hyperactive sometimes. Preoccupied with discharge. Denies SI/HI. Also denies AH/VH.

## 2015-09-19 DIAGNOSIS — F39 Unspecified mood [affective] disorder: Secondary | ICD-10-CM

## 2015-09-20 ENCOUNTER — Inpatient Hospital Stay
Admit: 2015-09-20 | Discharge: 2015-09-20 | Disposition: A | Discharge status: Home / Self Care | Payer: MEDICAID | Attending: Emergency Medicine

## 2015-09-20 NOTE — ED Provider Notes (Signed)
HPI Comments: Kevin Coffey is a 35 y.o. male who presents to the ED with complaint of worsening manic episodes and concerns of violence. He has been having thoughts of SI and HI, and so his kids and his wife told him to come back to the hospital. Pt was just discharged from behavioral health here at [facility] earlier today where he was given a diagnosis of mood disorder antisocial personality disorder after experiencing manic episodes where he was throwing stuff. He was discharged home and felt anxiety and panicked when he returned home. Pt denies EtOH use, recreational drugs. Denies recent trauma, fall, abdominal pain, fevers, chills, SOB.    The history is provided by the patient.        Past Medical History:   Diagnosis Date   ??? Aggressive outburst    ??? Antisocial personality disorder in adult 09/13/2015   ??? Anxiety disorder    ??? Asthma    ??? Bipolar affective disorder, current episode severe (HCC) 09/12/2015   ??? Cigarette nicotine dependence with withdrawal (HCC) 09/12/2015   ??? Depression    ??? Hyperammonemia (HCC) 09/13/2015    Etiology and Chronicity Unknown    ??? Marital relationship problem 09/12/2015   ??? Mood disorder (HCC) 09/13/2015   ??? Psychiatric disorder     Depression       History reviewed. No pertinent surgical history.      History reviewed. No pertinent family history.    Social History     Social History   ??? Marital status: MARRIED     Spouse name: N/A   ??? Number of children: N/A   ??? Years of education: N/A     Occupational History   ??? Not on file.     Social History Main Topics   ??? Smoking status: Current Every Day Smoker     Packs/day: 0.50     Years: 20.00     Types: Cigarettes   ??? Smokeless tobacco: Never Used   ??? Alcohol use Yes   ??? Drug use: 1.00 per week     Special: Marijuana   ??? Sexual activity: Yes     Partners: Female     Other Topics Concern   ??? Not on file     Social History Narrative         ALLERGIES: Review of patient's allergies indicates no known allergies.     Review of Systems   Constitutional: Negative.  Negative for chills and fever.   HENT: Negative.  Negative for postnasal drip, rhinorrhea and sore throat.    Eyes: Negative.  Negative for pain and discharge.   Respiratory: Negative.  Negative for cough, chest tightness and shortness of breath.    Cardiovascular: Negative.  Negative for chest pain and palpitations.   Gastrointestinal: Negative.  Negative for abdominal pain, diarrhea, nausea and vomiting.   Genitourinary: Negative.  Negative for dysuria, flank pain and frequency.   Musculoskeletal: Negative.  Negative for arthralgias and myalgias.   Skin: Negative.  Negative for rash.   Allergic/Immunologic: Negative.  Negative for immunocompromised state.   Neurological: Negative.  Negative for dizziness, weakness, light-headedness and numbness.   Psychiatric/Behavioral: Positive for suicidal ideas.        Ideas of violence   All other systems reviewed and are negative.      There were no vitals filed for this visit.         Physical Exam   Constitutional: He is oriented to person, place, and time.  He appears well-developed and well-nourished. No distress.   HENT:   Head: Normocephalic and atraumatic.   Mouth/Throat: Oropharynx is clear and moist. No oropharyngeal exudate.   Eyes: Conjunctivae and EOM are normal. Pupils are equal, round, and reactive to light. Right eye exhibits no discharge. Left eye exhibits no discharge. No scleral icterus.   5 mm pupils, non-dilated.   Neck: Normal range of motion. Neck supple. No thyromegaly present.   Cardiovascular: Normal rate, regular rhythm, normal heart sounds and intact distal pulses.  Exam reveals no gallop and no friction rub.    No murmur heard.  Pulmonary/Chest: Breath sounds normal. No respiratory distress. He has no wheezes. He has no rales.   Abdominal: Soft. Bowel sounds are normal. He exhibits no distension. There is no tenderness. There is no rebound and no guarding.    Musculoskeletal: Normal range of motion. He exhibits no edema or tenderness.   Lymphadenopathy:     He has no cervical adenopathy.   Neurological: He is alert and oriented to person, place, and time.   Skin: Skin is warm and dry. No rash noted. He is not diaphoretic.   Psychiatric: His behavior is normal. Judgment normal. He exhibits a depressed mood. He expresses homicidal (Ideas, no plan.) and suicidal (Ideas, no plan) ideation.   Nursing note and vitals reviewed.       MDM  Number of Diagnoses or Management Options  Diagnosis management comments: 35 y.o. male with hx of mood disorder presenting with recurrence of SI and an inability to cope after discharge from behavioral health earlier today.      Plan:     Psych Child psychotherapistsocial worker  -----  12:29 AM  Documented by Helane Guntherichard Skelton, acting as a scribe for Dr. Armida SansMichelle Eon Zunker, MD      PROVIDER ATTESTATION:  6:48 AM    The entirety of this note, signed by me, accurately reflects all works, treatments, procedures, and medical decision making performed by me, Armida SansMichelle Avien Taha, MD.            Patient Progress  Patient progress: stable    ED Course   Diagnostic Studies:      Lab Data:  Labs Reviewed - No data to display      ED Course:     Patient was seen by psych social worker. Was advised to have [facility]. No inpatient admission recommended at this time.       Procedures

## 2015-09-20 NOTE — ED Notes (Signed)
Pt is asleep, no distress.  PATIENT IS BEING SAT WITH SECURITY OFFICER.  PLEASE SEE SCANNED SHEET FOR 15 MINUTE CHECKS

## 2015-09-20 NOTE — ED Notes (Signed)
[facility] team at bedside

## 2015-09-20 NOTE — ED Notes (Signed)
Pt had refused the triage a few hours ago.

## 2015-09-20 NOTE — ED Notes (Signed)
Bedside shift change report given to Abey, RN (oncoming nurse) by Tracie RN (offgoing nurse). Report included the following information SBAR, Kardex, ED Summary, Procedure Summary, Intake/Output, MAR and Recent Results.

## 2015-09-20 NOTE — ED Notes (Signed)
PATIENT IS BEING SAT WITH SECURITY OFFICER.  PLEASE SEE SCANNED SHEET FOR 15 MINUTE CHECKS  Pt sleeping. No distress.

## 2015-09-20 NOTE — ED Notes (Signed)
[facility] accepted Patient and MD made aware

## 2015-09-20 NOTE — ED Notes (Signed)
PATIENT IS BEING SAT WITH SECURITY OFFICER.  PLEASE SEE SCANNED SHEET FOR 15 MINUTE CHECKS  No distress.

## 2015-09-20 NOTE — ED Notes (Signed)
PATIENT IS BEING SAT WITH SECURITY OFFICER.  PLEASE SEE SCANNED SHEET FOR 15 MINUTE CHECKS
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
## 2015-09-20 NOTE — ED Notes (Signed)
Assumed care of patient for discharge purposes only, questions answered and verbalized understanding of discharge instruction. . Ambulated well out of room 10 with [facility]. Primary RN aware.-abey aware

## 2015-09-20 NOTE — ED Notes (Signed)
PATIENT IS BEING SAT WITH SECURITY OFFICER.  PLEASE SEE SCANNED SHEET FOR 15 MINUTE CHECKS  Pt changed into scrubs, belongings taken by Security.

## 2015-09-20 NOTE — ED Notes (Signed)
breathalizer is  0

## 2015-09-20 NOTE — ED Notes (Signed)
Bedside shift change report given to RN abey (oncoming nurse) by Elouise Munroeracie Talani (offgoing nurse). Report included the following information SBAR, Kardex, ED Summary, [facility] and Recent Results.   Patient is being sat with a Police Officer/security in the psyche room, every 15 minutes checked done by the officer and charted on a paper flow sheet.  Awaits [facility] Eval

## 2016-11-19 ENCOUNTER — Inpatient Hospital Stay
Admit: 2016-11-19 | Discharge: 2016-11-20 | Disposition: A | Discharge status: Home / Self Care | Payer: PRIVATE HEALTH INSURANCE | Attending: Specialist

## 2016-11-19 DIAGNOSIS — G44201 Tension-type headache, unspecified, intractable: Secondary | ICD-10-CM

## 2016-11-19 LAB — CBC WITH AUTOMATED DIFF
ABS. BASOPHILS: 0 10*3/uL (ref 0.0–0.2)
ABS. EOSINOPHILS: 0.1 10*3/uL (ref 0.0–0.7)
ABS. LYMPHOCYTES: 1.3 10*3/uL (ref 1.2–3.4)
ABS. MONOCYTES: 0.4 10*3/uL — ABNORMAL LOW (ref 1.1–3.2)
ABS. NEUTROPHILS: 2.6 10*3/uL (ref 1.4–6.5)
BASOPHILS: 1 % (ref 0–2)
EOSINOPHILS: 2 % (ref 0–5)
HCT: 42.8 % (ref 36.8–45.2)
HGB: 14.4 g/dL (ref 12.8–15.0)
IMMATURE GRANULOCYTES: 0 % (ref 0.0–5.0)
LYMPHOCYTES: 30 % (ref 16–40)
MCH: 27.1 PG (ref 27–31)
MCHC: 33.6 g/dL (ref 32–36)
MCV: 80.5 FL — ABNORMAL LOW (ref 81–99)
MONOCYTES: 9 % (ref 0–12)
MPV: 10.6 FL — ABNORMAL HIGH (ref 7.4–10.4)
NEUTROPHILS: 58 % (ref 40–70)
PLATELET: 150 10*3/uL (ref 140–450)
RBC: 5.32 M/uL — ABNORMAL HIGH (ref 4.0–5.2)
RDW: 14.1 % (ref 11.5–14.5)
WBC: 4.4 10*3/uL — ABNORMAL LOW (ref 4.8–10.8)

## 2016-11-19 LAB — METABOLIC PANEL, COMPREHENSIVE
A-G Ratio: 1.1 (ref 1.0–3.1)
ALT (SGPT): 15 U/L (ref 12–78)
AST (SGOT): 13 U/L — ABNORMAL LOW (ref 15–37)
Albumin: 4 g/dL (ref 3.4–5.0)
Alk. phosphatase: 79 U/L (ref 46–116)
Anion gap: 14 mmol/L (ref 10–17)
BUN/Creatinine ratio: 14 (ref 6.0–20.0)
BUN: 12 MG/DL (ref 7–18)
Bilirubin, total: 0.4 MG/DL (ref 0.2–1.0)
CO2: 27 mmol/L (ref 21–32)
Calcium: 8.8 MG/DL (ref 8.5–10.1)
Chloride: 101 mmol/L (ref 98–107)
Creatinine: 0.85 MG/DL (ref 0.6–1.3)
GFR est AA: >60 mL/min/{1.73_m2} (ref >60)
GFR est non-AA: >60 mL/min/{1.73_m2} (ref >60)
Globulin: 3.5 g/dL
Glucose: 95 mg/dL (ref 74–106)
Potassium: 3.9 mmol/L (ref 3.5–5.1)
Protein, total: 7.5 g/dL (ref 6.4–8.2)
Sodium: 138 mmol/L (ref 136–145)

## 2016-11-19 MED ORDER — KETOROLAC TROMETHAMINE 30 MG/ML INJECTION
30 mg/mL (1 mL) | INTRAMUSCULAR | Status: AC
Start: 2016-11-19 — End: 2016-11-19
  Administered 2016-11-19: 23:00:00 via INTRAVENOUS

## 2016-11-19 MED ORDER — METOCLOPRAMIDE 5 MG/ML IJ SOLN
5 mg/mL | INTRAMUSCULAR | Status: AC
Start: 2016-11-19 — End: 2016-11-19
  Administered 2016-11-19: 23:00:00 via INTRAVENOUS

## 2016-11-19 MED ORDER — DIPHENHYDRAMINE HCL 50 MG/ML IJ SOLN
50 mg/mL | INTRAMUSCULAR | Status: AC
Start: 2016-11-19 — End: 2016-11-19
  Administered 2016-11-19: 23:00:00 via INTRAVENOUS

## 2016-11-19 MED FILL — KETOROLAC TROMETHAMINE 30 MG/ML INJECTION: 30 mg/mL (1 mL) | INTRAMUSCULAR | Qty: 1

## 2016-11-19 MED FILL — DIPHENHYDRAMINE HCL 50 MG/ML IJ SOLN: 50 mg/mL | INTRAMUSCULAR | Qty: 1

## 2016-11-19 MED FILL — METOCLOPRAMIDE 5 MG/ML IJ SOLN: 5 mg/mL | INTRAMUSCULAR | Qty: 2

## 2016-11-19 NOTE — ED Notes (Signed)
Kevin Coffey is a 36 y.o. male arrived to ED with complaints of headache. Pt states that he feels that he is having an allergic reaction to his psychiatric medication sertaline. Pt has had piv inserted, blood collected and sent to lab. Pt is being medicated at this time.

## 2016-11-19 NOTE — ED Triage Notes (Signed)
Came in via ambo for a headache

## 2016-11-19 NOTE — ED Provider Notes (Signed)
HPI Comments: Kevin Coffey is a 36 y.o. male with a hx of depression and anxiety who presents to the ED via EMS with complaint of throbbing headache x2 weeks ever since being started on Sertraline, Vistaril and Prazosin. Associated sxs include nausea, vomiting and diaphoresis. He is currently is rehab. Next appointment with his doctor in on 8/24.     Patient is a 36 y.o. male presenting with headaches. The history is provided by the patient.   Headache    This is a new problem. The current episode started more than 1 week ago. The problem occurs constantly. The problem has not changed since onset.The quality of the pain is described as throbbing. The pain is at a severity of 7/10. Associated symptoms include nausea and vomiting. Pertinent negatives include no fever, no palpitations, no shortness of breath, no weakness and no dizziness.        Past Medical History:   Diagnosis Date   ??? Aggressive outburst    ??? Antisocial personality disorder in adult 09/13/2015   ??? Anxiety disorder    ??? Asthma    ??? Bipolar affective disorder, current episode severe (HCC) 09/12/2015   ??? Cigarette nicotine dependence with withdrawal 09/12/2015   ??? Depression    ??? Hyperammonemia (HCC) 09/13/2015    Etiology and Chronicity Unknown    ??? Marital relationship problem 09/12/2015   ??? Mood disorder (HCC) 09/13/2015   ??? Psychiatric disorder     Depression       History reviewed. No pertinent surgical history.      History reviewed. No pertinent family history.    Social History     Social History   ??? Marital status: MARRIED     Spouse name: N/A   ??? Number of children: N/A   ??? Years of education: N/A     Occupational History   ??? Not on file.     Social History Main Topics   ??? Smoking status: Current Every Day Smoker     Packs/day: 0.50     Years: 20.00     Types: Cigarettes   ??? Smokeless tobacco: Never Used   ??? Alcohol use Yes   ??? Drug use: 1.00 per week     Special: Marijuana   ??? Sexual activity: Yes     Partners: Female      Other Topics Concern   ??? Not on file     Social History Narrative         ALLERGIES: Review of patient's allergies indicates no known allergies.    Review of Systems   Constitutional: Positive for diaphoresis. Negative for chills and fever.   HENT: Negative.  Negative for rhinorrhea and sore throat.    Respiratory: Negative.  Negative for cough, chest tightness and shortness of breath.    Cardiovascular: Negative.  Negative for chest pain, palpitations and leg swelling.   Gastrointestinal: Positive for nausea and vomiting. Negative for abdominal pain and diarrhea.   Endocrine: Negative.  Negative for polydipsia and polyuria.   Genitourinary: Negative.  Negative for dysuria, frequency and hematuria.   Musculoskeletal: Negative.  Negative for arthralgias, myalgias, neck pain and neck stiffness.   Skin: Negative.  Negative for rash.   Neurological: Positive for headaches. Negative for dizziness, weakness, light-headedness and numbness.   All other systems reviewed and are negative.      Vitals:    11/19/16 1845   BP: (!) 138/98   Pulse: 60   Resp: 20   Temp:  98 ??F (36.7 ??C)   SpO2: 99%   Weight: 68 kg (150 lb)   Height: 5\' 8"  (1.727 m)            Physical Exam   Constitutional: He is oriented to person, place, and time. He appears well-developed and well-nourished. No distress.   HENT:   Head: Normocephalic and atraumatic.   Mouth/Throat: Oropharynx is clear and moist. No oropharyngeal exudate.   Eyes: Conjunctivae and EOM are normal. Pupils are equal, round, and reactive to light. Right eye exhibits no discharge. Left eye exhibits no discharge. No scleral icterus.   Neck: Normal range of motion. Neck supple. No thyromegaly present.   Cardiovascular: Normal rate, regular rhythm, normal heart sounds and intact distal pulses.  Exam reveals no gallop and no friction rub.    No murmur heard.  Pulmonary/Chest: Breath sounds normal. No respiratory distress. He has no wheezes. He has no rales.    Abdominal: Soft. Bowel sounds are normal. He exhibits no distension. There is no tenderness. There is no rebound and no guarding.   Musculoskeletal: Normal range of motion. He exhibits no edema or tenderness.   Lymphadenopathy:     He has no cervical adenopathy.   Neurological: He is alert and oriented to person, place, and time.   Skin: Skin is warm and dry. No rash noted. He is not diaphoretic.   Psychiatric: He has a normal mood and affect. His behavior is normal. Judgment and thought content normal.   Nursing note and vitals reviewed.       MDM  Number of Diagnoses or Management Options  Acute intractable tension-type headache:   Diagnosis management comments: 36 y.o. male presents to the ED with a 2 week waxing and waning headache after being started on new medications including sertraline, vistaril and prazosin.   Acute intractable tension-type headache  (primary encounter diagnosis)    Plan: Discharge Patient with written instructions. Medications for this visit No current facility-administered medications for this encounter.   Current Outpatient Prescriptions:  sertraline (ZOLOFT) 100 mg tablet, Take 25 mg by mouth daily.  prazosin (MINIPRESS) 1 mg capsule, Take 1 mg by mouth nightly.  hydrOXYzine pamoate (VISTARIL) 25 mg capsule, Take 25 mg by mouth three (3) times daily as needed for Itching.  QUEtiapine (SEROQUEL) 100 mg tablet, Take 100 mg by mouth two (2) times a day.  ibuprofen (MOTRIN) 800 mg tablet, Take 1 Tab by mouth every eight (8) hours as needed for Pain for up to 7 days.   Orders Placed This Encounter      CBC WITH AUTOMATED DIFF      METABOLIC PANEL, COMPREHENSIVE      INSERT PERIPHERAL IV ONE TIME Routine      diphenhydrAMINE (BENADRYL) injection 25 mg      metoclopramide HCl (REGLAN) injection 10 mg      sertraline (ZOLOFT) 100 mg tablet      prazosin (MINIPRESS) 1 mg capsule      hydrOXYzine pamoate (VISTARIL) 25 mg capsule      QUEtiapine (SEROQUEL) 100 mg tablet      -----  6:55 PM   Documented by Mousumi S Reja, acting as a scribe for Dr. Lawana Chambersarlene Y Raela Bohl, MD      PROVIDER ATTESTATION:  4:56 PM    The entirety of this note, signed by me, accurately reflects all works, treatments, procedures, and medical decision making performed by me, Lawana Chambersarlene Y Cassie Shedlock, MD.  Patient Progress  Patient progress: stable        ED Course     Diagnostic Studies:        Lab Data:  Labs Reviewed - No data to display      ED Course:   8:13 PM  Pt is resting comfortably.     9:35 PM  Pt states that his sxs have slightly resolved. He appears well and in no distress. I informed him that his lab results were unremarkable. Will discharge home with a prescription for motrin.     9:47 PM  I discussed with the pt that he should call his doctors office and leave a message about his sxs since starting his new medications. He has an appointment scheduled for the end of August but he should be able to reach his doctor for issues regarding his prescription in the meantime.       Procedures

## 2016-11-19 NOTE — ED Notes (Signed)
Pt is being discharged to home at this time.

## 2016-11-20 MED ORDER — IBUPROFEN 800 MG TAB
800 mg | ORAL_TABLET | Freq: Three times a day (TID) | ORAL | 0 refills | Status: AC | PRN
Start: 2016-11-20 — End: 2016-11-26

## 2017-02-25 DIAGNOSIS — Z0000 Encounter for general adult medical examination without abnormal findings: Secondary | ICD-10-CM

## 2017-02-25 NOTE — ED Provider Notes (Addendum)
Kevin Coffey is a 36 y.o. male with a h/o drug abuse who presents to the ED via EMS with complaint of "I need medical clearance to get cleared to stay in my drug rehab facility." Pt has no medical complaints at this time. No SI/HI, hallucinations, or acute somatic sx. Pt only came to the ED, as it was required for entry into the program. Pt denies other significant PMHx or PSHx.         The history is provided by the patient.   Other   This is a new problem. The current episode started less than 1 hour ago. The problem occurs constantly. The problem has not changed since onset.Pertinent negatives include no chest pain, no abdominal pain, no headaches and no shortness of breath. Nothing aggravates the symptoms. Nothing relieves the symptoms. He has tried nothing for the symptoms.        Past Medical History:   Diagnosis Date   ??? Aggressive outburst    ??? Antisocial personality disorder in adult [facility](HCC) 09/13/2015   ??? Anxiety disorder    ??? Asthma    ??? Bipolar affective disorder, current episode severe (HCC) 09/12/2015   ??? Cigarette nicotine dependence with withdrawal 09/12/2015   ??? Depression    ??? Hyperammonemia (HCC) 09/13/2015    Etiology and Chronicity Unknown    ??? Marital relationship problem 09/12/2015   ??? Mood disorder (HCC) 09/13/2015   ??? Psychiatric disorder     Depression       History reviewed. No pertinent surgical history.      History reviewed. No pertinent family history.    Social History     Socioeconomic History   ??? Marital status: MARRIED     Spouse name: Not on file   ??? Number of children: Not on file   ??? Years of education: Not on file   ??? Highest education level: Not on file   Social Needs   ??? Financial resource strain: Not on file   ??? Food insecurity - worry: Not on file   ??? Food insecurity - inability: Not on file   ??? Transportation needs - medical: Not on file   ??? Transportation needs - non-medical: Not on file   Occupational History   ??? Not on file   Tobacco Use    ??? Smoking status: Current Every Day Smoker     Packs/day: 0.50     Years: 20.00     Pack years: 10.00     Types: Cigarettes   ??? Smokeless tobacco: Never Used   Substance and Sexual Activity   ??? Alcohol use: Yes   ??? Drug use: Yes     Frequency: 1.0 times per week     Types: Marijuana   ??? Sexual activity: Yes     Partners: Female   Other Topics Concern   ??? Not on file   Social History Narrative   ??? Not on file         ALLERGIES: Patient has no known allergies.    Review of Systems   Constitutional: Negative.  Negative for chills and fever.   HENT: Negative.  Negative for congestion and sore throat.    Respiratory: Negative.  Negative for cough and shortness of breath.    Cardiovascular: Negative.  Negative for chest pain and palpitations.   Gastrointestinal: Negative.  Negative for abdominal pain, diarrhea, nausea and vomiting.   Genitourinary: Negative.  Negative for dysuria and frequency.   Musculoskeletal: Negative.  Negative  for arthralgias and myalgias.   Skin: Negative.  Negative for pallor and rash.   Allergic/Immunologic: Negative.  Negative for immunocompromised state.   Neurological: Negative.  Negative for weakness, light-headedness and headaches.   All other systems reviewed and are negative.      Vitals:    02/25/17 2305 02/25/17 2314   BP: 154/90    Pulse: 88    Resp: 18    Temp: 98 ??F (36.7 ??C)    SpO2: 99% 100%            Physical Exam   Constitutional: He is oriented to person, place, and time. He appears well-developed and well-nourished.   HENT:   Head: Normocephalic and atraumatic.   Eyes: Conjunctivae and EOM are normal. Pupils are equal, round, and reactive to light. Right eye exhibits no discharge. Left eye exhibits no discharge. No scleral icterus.   Neck: Normal range of motion. Neck supple.   Cardiovascular: Normal rate, regular rhythm and normal heart sounds. Exam reveals no gallop and no friction rub.   No murmur heard.   Pulmonary/Chest: Effort normal and breath sounds normal. No respiratory distress. He has no wheezes. He has no rales.   Abdominal: Soft. Bowel sounds are normal. He exhibits no distension. There is no tenderness. There is no rebound and no guarding.   Musculoskeletal: Normal range of motion. He exhibits no edema.   Neurological: He is alert and oriented to person, place, and time. Gait normal.   Normal voice and gait no intoxicated toxidrome   Skin: Skin is warm and dry. No rash noted. No erythema.   Psychiatric: He has a normal mood and affect. His behavior is normal. Judgment and thought content normal.   Nursing note and vitals reviewed.       MDM  Number of Diagnoses or Management Options  Normal physical exam:   Diagnosis management comments: 36 y.o. male with request for medical clearance to enter drug rehab program. Pt has no medical complaints, VS stable, and he is neurologically intact. Not intoxicated clinically.      Plan:   Call drug rehab program  D/C home with PCP F/U as needed      -----  11:13 PM  Documented by Alric Setonravis N Tracy, acting as a scribe for Dr. Bonnetta BarryNo att. providers found     PROVIDER ATTESTATION:  11:50 PM    The entirety of this note, signed by me, accurately reflects all works, treatments, procedures, and medical decision making performed by me, Pennie Rushinganiel B Keean Wilmeth, MD.      Patient Progress  Patient progress: stable       Diagnostic Studies:      Lab Data:  Labs Reviewed - No data to display      ED Course:   I discussed with drug program he is cleared to return and the ED does not do drug testing  11:51 PM Patient educated on pertinent findings and treatment plan. Patient was given the opportunity to ask questions and voiced understanding and agreement with the treatment plan. I provided verbal and written anticipatory guidance, instructions for return to the ED, and instructions for follow up. Patient is stable for discharge.    Procedures

## 2017-02-25 NOTE — ED Notes (Signed)
Address of [facility] [address] [place]  Managers number: [phone]

## 2017-02-25 NOTE — ED Triage Notes (Signed)
Per EMS, manager of [facility], Paulino Rilyarrell Sterling, called EMS concerned that patient may be under the influence and would like for patient to be cleared medically prior to returning to drug program. Pt is alert and verbal ambulatory with steady gait. Oriented x 4. No acute distress observed. Pt requested breathalyzer. Breathalyzer performed at bedside for screening purposes only. Result: 0.000 (negative). MD at bedside to assess.

## 2017-02-26 ENCOUNTER — Inpatient Hospital Stay
Admit: 2017-02-26 | Discharge: 2017-02-26 | Disposition: A | Discharge status: Home / Self Care | Payer: PRIVATE HEALTH INSURANCE | Attending: Student in an Organized Health Care Education/Training Program

## 2017-02-26 NOTE — ED Notes (Signed)
I have reviewed discharge instructions with the patient.  The patient verbalized understanding.

## 2017-05-24 ENCOUNTER — Inpatient Hospital Stay
Admit: 2017-05-24 | Discharge: 2017-05-25 | Disposition: A | Discharge status: Home / Self Care | Payer: PRIVATE HEALTH INSURANCE | Attending: Family Medicine

## 2017-05-24 DIAGNOSIS — L03011 Cellulitis of right finger: Secondary | ICD-10-CM

## 2017-05-24 MED ORDER — LIDOCAINE HCL 2 % (20 MG/ML) IJ SOLN
20 mg/mL (2 %) | INTRAMUSCULAR | Status: AC
Start: 2017-05-24 — End: 2017-05-24
  Administered 2017-05-24: 23:00:00 via INTRADERMAL

## 2017-05-24 MED ORDER — AMOXICILLIN CLAVULANATE 875 MG-125 MG TAB
875-125 mg | ORAL | Status: AC
Start: 2017-05-24 — End: 2017-05-24
  Administered 2017-05-24: via ORAL

## 2017-05-24 MED ORDER — AMOXICILLIN CLAVULANATE 875 MG-125 MG TAB
875-125 mg | ORAL_TABLET | Freq: Two times a day (BID) | ORAL | 0 refills | Status: AC
Start: 2017-05-24 — End: 2017-05-31

## 2017-05-24 MED ORDER — BACITRACIN 500 UNIT/G TOPICAL PACKET
500 unit/gram | CUTANEOUS | Status: AC
Start: 2017-05-24 — End: 2017-05-24
  Administered 2017-05-24: via TOPICAL

## 2017-05-24 MED FILL — AMOXICILLIN CLAVULANATE 875 MG-125 MG TAB: 875-125 mg | ORAL | Qty: 1

## 2017-05-24 MED FILL — XYLOCAINE 20 MG/ML (2 %) INJECTION SOLUTION: 20 mg/mL (2 %) | INTRAMUSCULAR | Qty: 10

## 2017-05-24 MED FILL — BACITRACIN 500 UNIT/G TOPICAL PACKET: 500 unit/gram | CUTANEOUS | Qty: 1

## 2017-05-24 NOTE — ED Notes (Signed)
Discharge instructions given with 1 prescriptions. Pt to follow up with his PCP or return if symptoms worsen. All questions were answered at this time, pt verbalized understanding. Pt condition is stable.

## 2017-05-24 NOTE — ED Triage Notes (Signed)
Pt reporting 1 week of Right 4th finger swelling and pain after biting his fingernails and creating an infection.

## 2017-05-24 NOTE — Progress Notes (Signed)
Readmission Risk Screening      Name: Kevin Coffey    Date:05/24/2017   Room #:       Column 1 Column 2 Column 3    ???Do/Have/Are you??? Low Risk Moderate Risk High Risk       Row 1   Living Situation have a live-in caregiver to assist you  OR  live alone with community support  [x]                                     live alone with limited community support  []                                   live alone with no community support  OR  have live-in family who aren't actively involved in your care  []                                     Row 2 Physician have a  Primary Care  doctor [x]       [facility] [address]                            N/A []                                   NOT have a Primary Care doctor (4pts) []                                     Row 3 Insurance insured [x]                                   N/A []                                   uninsured (4pts) []                                     Row 4 Activities of Daily Living (ADL/IADL) (ADls): groom, feed, dress, and get to the bathroom independently  (IADls): shop, budget, keep house, travel, manage medications independently  [x]                                     require temporary or regular assistance with ADls or IADls  []                                     require considerable assistance with ADls or IADls  []                                     Row 5 Disease Mgmt feel capable to  manage your chronic illness and follow  your  doctor's orders independently  [x]                                   N/A []                                   require considerable assistance managing your chronic illness and other medical needs (clinically complex) []                                     Row 6 Meds Mgmt feel capable to manage your medications independently [x]                                   require some assistance managing your medication []                                   require considerable assistance managing your medications []                                      Row 7 Polypharmacy N/A [x]              take> 7 different medications at home []              N/A []              Row 8 Cognitive Ability feel capable to make your own  health care decisions [x]                                   sometimes have difficulty making your own health care decisions []                                   feel incapable to make your own health care decisions (cognitive impairment) []                                     Row 9 CHF N/A [x]                                   N/A []                                   have Congestive Heart Failure (4pts)  []                                     Row 10 COPD N/A [x]   N/A []                                   have COPD (4pts) []                                     Row 11 Diabetes N/A [x]                                   N/A []                                   have Diabetes (4pts)  []                                     Row 12 HIV/AIDS N/A [x]                                   N/A []                                   have HIVor AIDS (4pts) []                                     Row 13 Fall Risk N/A [x]                                   N/A []                                   have a history of falls (4pts) []                                     Row 14 Wound Care N/A [x]                                   N/A []                                   wounds or pressure ulcers (4pts) []                                     Row 15 Alcohol N/A [x]                                   N/A []   had> 4 alcoholic drinks/day during last 12 months (4pts)  []                                     Row 16 Drugs N/A [x]              N/A []              used marijuana, cocaine, heroin, or anything else to get "high" during last 12 months (4pts) []              Row 17 Mental Health N/A []                                   have a history of mental illness []                                   have a  history of mental illness (4pts) [x]                                     Row 18 Readmission? N/A [x]                                   N/A []                                   been hospitalized in the last 30 days (4pts)  []                                     Row 19 ACO/MSSP N/A [x]                                   N/A []                                   enrolled in the Medicare Shared Savings Program(4pts) []                                      Totals:  7    (Low Risk)                                 0 4      Screening completed by: Delman Kitten, 05/24/2017

## 2017-05-24 NOTE — ED Provider Notes (Signed)
Kevin Coffey is a 37 y.o. male with hx of asthma, depression, bipolar disorderswho presents to the ED with complaint of R ring finger pain. Pt reports he bites his nail, he had a hang nail, he bit it off and now his finger is swollen. Pain is described as constant, aching, non radiating, worsened with palpation and mild. No fever/chills, rash, abd pain, n/v/d, numbness or tingling.         The history is provided by the patient.   Hand Pain    This is a new problem. The problem occurs constantly. The problem has not changed since onset.The pain is present in the right fingers. The quality of the pain is described as aching. The pain is mild. Pertinent negatives include no numbness, no stiffness and no tingling. He has tried nothing for the symptoms.        Past Medical History:   Diagnosis Date   ??? Aggressive outburst    ??? 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 Anxiety disorder    ??? Asthma    ??? Bipolar affective disorder, current episode severe (HCC) 09/12/2015   ??? Cigarette nicotine dependence with withdrawal 09/12/2015   ??? Depression    ??? Hyperammonemia (HCC) 09/13/2015    Etiology and Chronicity Unknown    ??? Marital relationship problem 09/12/2015   ??? Mood disorder (HCC) 09/13/2015   ??? Psychiatric disorder     Depression       History reviewed. No pertinent surgical history.      History reviewed. No pertinent family history.    Social History     Socioeconomic History   ??? Marital status: MARRIED     Spouse name: Not on file   ??? Number of children: Not on file   ??? Years of education: Not on file   ??? Highest education level: Not on file   Social Needs   ??? Financial resource strain: Not on file   ??? Food insecurity - worry: Not on file   ??? Food insecurity - inability: Not on file   ??? Transportation needs - medical: Not on file   ??? Transportation needs - non-medical: Not on file   Occupational History   ??? Not on file   Tobacco Use   ??? Smoking status: Current Every Day Smoker      Packs/day: 0.50     Years: 20.00     Pack years: 10.00     Types: Cigarettes   ??? Smokeless tobacco: Never Used   Substance and Sexual Activity   ??? Alcohol use: No     Frequency: Never     Comment: last use over 1 yr ago.   ??? Drug use: Yes     Frequency: 1.0 times per week     Types: Marijuana     Comment: last use Feb 05 2017   ??? Sexual activity: Yes     Partners: Female   Other Topics Concern   ??? Not on file   Social History Narrative   ??? Not on file         ALLERGIES: Patient has no known allergies.    Review of Systems   Constitutional: Negative for chills and fever.   Respiratory: Negative for cough and shortness of breath.    Cardiovascular: Negative for chest pain.   Gastrointestinal: Negative for abdominal pain, diarrhea, nausea and vomiting.   Genitourinary: Negative.  Negative for dysuria.   Musculoskeletal: Negative.  Negative for stiffness.  Finger pain and swelling   Skin: Negative.    Neurological: Negative.  Negative for tingling, weakness and numbness.   Psychiatric/Behavioral: Negative.    All other systems reviewed and are negative.      Vitals:    05/24/17 1755 05/24/17 1810   BP: 144/71    Pulse: 93    Resp: 15    Temp: 98.2 ??F (36.8 ??C)    SpO2: 98% 98%   Weight: 84.8 kg (187 lb)    Height: 5\' 9"  (1.753 m)             Physical Exam   Constitutional: He is oriented to person, place, and time. He appears well-developed and well-nourished.   HENT:   Head: Normocephalic.   Cardiovascular: Normal rate, regular rhythm and normal heart sounds.   No murmur heard.  Pulmonary/Chest: Effort normal and breath sounds normal. No respiratory distress. He has no wheezes. He has no rales.   Musculoskeletal: Normal range of motion.   Neurological: He is alert and oriented to person, place, and time.   Skin: Skin is warm.   Paronychia and felon R 4 th  finger   Area of fluctuance around the nail   Psychiatric: He has a normal mood and affect. His behavior is normal. Judgment and thought content normal.    Nursing note and vitals reviewed.       MDM  Number of Diagnoses or Management Options  Felon of finger:   Paronychia of finger of right hand:   Diagnosis management comments: 37 y.o. male with Paronychia and felon.       Plan:   I&D  Drainage  abx  Wound check in 2 days  F/u PCP    Patient educated on pertinent findings and treatment plan. Patient was given the opportunity to ask questions and voiced understanding and agreement with the treatment plan. I provided verbal and written anticipatory guidance, instructions for return to the ED, and instructions for follow up. Patient is stable for discharge.   -----  6:17 PM  Documented by Katrinka Blazing Lux, acting as a scribe for Dr. Jaymes Graff, MD      PROVIDER ATTESTATION:  7:37 PM    The entirety of this note, signed by me, accurately reflects all works, treatments, procedures, and medical decision making performed by me, Jaymes Graff, MD.           Patient Progress  Patient progress: stable         I&D Abcess Complex  Date/Time: 05/24/2017 6:19 PM  Performed by: Jaymes Graff, MD  Authorized by: Jaymes Graff, MD     Consent:     Consent obtained:  Verbal    Consent given by:  Patient    Risks discussed:  Bleeding, pain and infection  Location:     Indications for incision and drainage: Paronychia.    Location:  Upper extremity    Upper extremity location:  Finger    Finger location:  R ring finger  Pre-procedure details:     Skin preparation:  Chloraprep  Anesthesia (see MAR for exact dosages):     Anesthesia method:  Local infiltration    Local anesthetic:  Lidocaine 2% w/o epi  Procedure details:     Needle aspiration: no      Incision types:  Stab incision    Incision depth:  Subcutaneous    Drainage:  Purulent and bloody    Drainage amount:  Moderate  Post-procedure details:  Patient tolerance of procedure:  Tolerated well, no immediate complications            Diagnostic Studies:      Lab Data:  Labs Reviewed - No data to display      ED Course:

## 2017-05-24 NOTE — Progress Notes (Signed)
Care  Transition Note    Writer spoke with the patient and completed High Risk Readmission Screening. Patient made aware of the services that will be offered from the Care Transition Team. Patient declined services at this time.Patient verified pcp at bedside.   Follow up appointment will not be scheduled. Patient declined services offered. Patient prefers to schedule appointment.

## 2017-06-26 ENCOUNTER — Emergency Department: Admit: 2017-06-26 | Payer: PRIVATE HEALTH INSURANCE | Primary: Family Medicine

## 2017-06-26 ENCOUNTER — Inpatient Hospital Stay
Admit: 2017-06-26 | Discharge: 2017-06-27 | Disposition: A | Discharge status: Home / Self Care | Payer: PRIVATE HEALTH INSURANCE | Attending: Specialist

## 2017-06-26 ENCOUNTER — Emergency Department

## 2017-06-26 DIAGNOSIS — B349 Viral infection, unspecified: Secondary | ICD-10-CM

## 2017-06-26 LAB — METABOLIC PANEL, COMPREHENSIVE
A-G Ratio: 1.1 (ref 1.0–3.1)
ALT (SGPT): 29 U/L (ref 12–78)
AST (SGOT): 15 U/L (ref 15–37)
Albumin: 4.5 g/dL (ref 3.4–5.0)
Alk. phosphatase: 98 U/L (ref 46–116)
Anion gap: 17 mmol/L (ref 10–17)
BUN/Creatinine ratio: 23 — ABNORMAL HIGH (ref 6.0–20.0)
BUN: 21 MG/DL — ABNORMAL HIGH (ref 7–18)
Bilirubin, total: 0.7 MG/DL (ref 0.2–1.0)
CO2: 24 mmol/L (ref 21–32)
Calcium: 9.7 MG/DL (ref 8.5–10.1)
Chloride: 101 mmol/L (ref 98–107)
Creatinine: 0.91 MG/DL (ref 0.6–1.3)
GFR est AA: >60 mL/min/{1.73_m2} (ref >60)
GFR est non-AA: >60 mL/min/{1.73_m2} (ref >60)
Globulin: 4.1 g/dL
Glucose: 110 mg/dL — ABNORMAL HIGH (ref 74–106)
Potassium: 3.8 mmol/L (ref 3.5–5.1)
Protein, total: 8.6 g/dL — ABNORMAL HIGH (ref 6.4–8.2)
Sodium: 138 mmol/L (ref 136–145)

## 2017-06-26 LAB — CBC WITH AUTOMATED DIFF
ABS. BASOPHILS: 0 10*3/uL (ref 0.0–0.2)
ABS. EOSINOPHILS: 0 10*3/uL (ref 0.0–0.7)
ABS. LYMPHOCYTES: 2 10*3/uL (ref 1.2–3.4)
ABS. MONOCYTES: 0.3 10*3/uL — ABNORMAL LOW (ref 1.1–3.2)
ABS. NEUTROPHILS: 5 10*3/uL (ref 1.4–6.5)
BASOPHILS: 0 % (ref 0–2)
EOSINOPHILS: 0 % (ref 0–5)
HCT: 45.7 % — ABNORMAL HIGH (ref 36.8–45.2)
HGB: 15 g/dL (ref 12.8–15.0)
IMMATURE GRANULOCYTES: 0 % (ref 0.0–5.0)
LYMPHOCYTES: 27 % (ref 16–40)
MCH: 26.6 PG — ABNORMAL LOW (ref 27–31)
MCHC: 32.8 g/dL (ref 32–36)
MCV: 81.2 FL (ref 81–99)
MONOCYTES: 5 % (ref 0–12)
MPV: 10.5 FL — ABNORMAL HIGH (ref 7.4–10.4)
NEUTROPHILS: 68 % (ref 40–70)
PLATELET: 213 10*3/uL (ref 140–450)
RBC: 5.63 M/uL — ABNORMAL HIGH (ref 4.0–5.2)
RDW: 14.4 % (ref 11.5–14.5)
WBC: 7.4 10*3/uL (ref 4.8–10.8)

## 2017-06-26 LAB — INFLUENZA A & B AG (RAPID TEST)
Influenza A Ag: NEGATIVE
Influenza B Ag: NEGATIVE

## 2017-06-26 LAB — PROTHROMBIN TIME + INR
INR: 1.1 (ref 0.9–1.2)
Prothrombin time: 11.4 s (ref 9.8–12.0)

## 2017-06-26 LAB — LIPASE: Lipase: 71 U/L (ref 65–230)

## 2017-06-26 LAB — PTT: aPTT: 29.3 s (ref 24.5–31.6)

## 2017-06-26 MED ORDER — KETOROLAC TROMETHAMINE 30 MG/ML INJECTION
30 mg/mL (1 mL) | INTRAMUSCULAR | Status: AC
Start: 2017-06-26 — End: 2017-06-26
  Administered 2017-06-26: 22:00:00 via INTRAVENOUS

## 2017-06-26 MED ORDER — SODIUM CHLORIDE 0.9% BOLUS IV
0.9 % | Freq: Once | INTRAVENOUS | Status: AC
Start: 2017-06-26 — End: 2017-06-26
  Administered 2017-06-26: 22:00:00 via INTRAVENOUS

## 2017-06-26 MED ORDER — ACETAMINOPHEN 325 MG TABLET
325 mg | ORAL | Status: AC
Start: 2017-06-26 — End: 2017-06-26
  Administered 2017-06-26: via ORAL

## 2017-06-26 MED ORDER — ONDANSETRON (PF) 4 MG/2 ML INJECTION
4 mg/2 mL | INTRAMUSCULAR | Status: AC
Start: 2017-06-26 — End: 2017-06-26
  Administered 2017-06-26: 22:00:00 via INTRAVENOUS

## 2017-06-26 MED ORDER — SODIUM CHLORIDE 0.9 % IJ SYRG
INTRAMUSCULAR | Status: DC | PRN
Start: 2017-06-26 — End: 2017-06-27

## 2017-06-26 MED ORDER — SODIUM CHLORIDE 0.9% BOLUS IV
0.9 % | INTRAVENOUS | Status: AC
Start: 2017-06-26 — End: 2017-06-26
  Administered 2017-06-27: via INTRAVENOUS

## 2017-06-26 MED FILL — KETOROLAC TROMETHAMINE 30 MG/ML INJECTION: 30 mg/mL (1 mL) | INTRAMUSCULAR | Qty: 1

## 2017-06-26 MED FILL — ONDANSETRON (PF) 4 MG/2 ML INJECTION: 4 mg/2 mL | INTRAMUSCULAR | Qty: 2

## 2017-06-26 MED FILL — SODIUM CHLORIDE 0.9 % IV: INTRAVENOUS | Qty: 1000

## 2017-06-26 NOTE — ED Notes (Signed)
Po challenged competed, pt tolerated po , no c/o n/v

## 2017-06-26 NOTE — ED Notes (Signed)
I have reviewed discharge instructions  and prescriptions with the patient.  The patient verbalized understanding.

## 2017-06-26 NOTE — ED Notes (Signed)
Bedside shift change report given to RN Marina (oncoming nurse) by Abey (offgoing nurse). Report included the following information SBAR, Kardex, ED Summary, MAR and Recent Results.

## 2017-06-26 NOTE — ED Triage Notes (Signed)
Pt to Er w/ c/o abd pain, N/V and generalized  weakness onset x1 week.

## 2017-06-26 NOTE — ED Provider Notes (Signed)
Kevin Coffey is a 37 y.o. male with hx of asthma and psychiatric disorder who presents to the ED with complaint of nausea and multiple episodes of non-bloody vomit x 1 week. Associated symptoms include inability to tolerate PO, chills, and constipation. Patient that because he was frustruated with consistently vomiting for a week, he punched a wall and now is complaining of right wrist pain. No fever, numbness, tingling, diarrhea, urinary complaints, chest pain, or SOB.      The history is provided by the patient. No language interpreter was used.   Abdominal Pain    This is a new problem. The current episode started more than 2 days ago (1 week). Associated symptoms include nausea and vomiting. Pertinent negatives include no fever, no diarrhea, no dysuria, no frequency, no arthralgias, no myalgias and no chest pain.   Nausea    Associated symptoms include abdominal pain. Pertinent negatives include no chills, no fever, no diarrhea, no arthralgias, no myalgias and no cough.   Vomiting    Associated symptoms include abdominal pain. Pertinent negatives include no chills, no fever, no diarrhea, no arthralgias, no myalgias and no cough.        Past Medical History:   Diagnosis Date   ??? Aggressive outburst    ??? 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 Anxiety disorder    ??? Asthma    ??? Bipolar affective disorder, current episode severe (HCC) 09/12/2015   ??? Cigarette nicotine dependence with withdrawal 09/12/2015   ??? Depression    ??? Hyperammonemia (HCC) 09/13/2015    Etiology and Chronicity Unknown    ??? Marital relationship problem 09/12/2015   ??? Mood disorder (HCC) 09/13/2015   ??? Psychiatric disorder     Depression       History reviewed. No pertinent surgical history.      History reviewed. No pertinent family history.    Social History     Socioeconomic History   ??? Marital status: MARRIED     Spouse name: Not on file   ??? Number of children: Not on file   ??? Years of education: Not on file    ??? Highest education level: Not on file   Social Needs   ??? Financial resource strain: Not on file   ??? Food insecurity - worry: Not on file   ??? Food insecurity - inability: Not on file   ??? Transportation needs - medical: Not on file   ??? Transportation needs - non-medical: Not on file   Occupational History   ??? Not on file   Tobacco Use   ??? Smoking status: Current Every Day Smoker     Packs/day: 0.50     Years: 20.00     Pack years: 10.00     Types: Cigarettes   ??? Smokeless tobacco: Never Used   Substance and Sexual Activity   ??? Alcohol use: No     Frequency: Never     Comment: last use over 1 yr ago.   ??? Drug use: Yes     Frequency: 1.0 times per week     Types: Marijuana     Comment: last use Feb 05 2017   ??? Sexual activity: Yes     Partners: Female   Other Topics Concern   ??? Not on file   Social History Narrative   ??? Not on file         ALLERGIES: Patient has no known allergies.    Review of Systems  Constitutional: Negative.  Negative for chills and fever.   HENT: Negative.  Negative for postnasal drip, rhinorrhea and sore throat.    Eyes: Negative.  Negative for pain and discharge.   Respiratory: Negative.  Negative for cough, chest tightness and shortness of breath.    Cardiovascular: Negative.  Negative for chest pain and palpitations.   Gastrointestinal: Positive for abdominal pain, nausea and vomiting. Negative for diarrhea.   Genitourinary: Negative.  Negative for dysuria, flank pain and frequency.   Musculoskeletal: Negative.  Negative for arthralgias and myalgias.        Right wrist pain   Skin: Negative.  Negative for rash.   Allergic/Immunologic: Negative.  Negative for immunocompromised state.   Neurological: Negative.  Negative for dizziness, weakness, light-headedness and numbness.   All other systems reviewed and are negative.      Vitals:    06/26/17 1646 06/26/17 1927   BP: (!) 150/92 123/77   Pulse: 90 92   Resp: 18 18   Temp: (!) 100.5 ??F (38.1 ??C) (!) 100.9 ??F (38.3 ??C)   SpO2: 98% 100%    Weight: 86.6 kg (191 lb)    Height: 5\' 8"  (1.727 Rakesh Dutko)             Physical Exam   Constitutional: He is oriented to person, place, and time. He appears well-developed and well-nourished. No distress.   HENT:   Head: Normocephalic and atraumatic.   Mouth/Throat: Oropharynx is clear and moist. No oropharyngeal exudate.   Eyes: Conjunctivae and EOM are normal. Pupils are equal, round, and reactive to light. Right eye exhibits no discharge. Left eye exhibits no discharge. No scleral icterus.   Neck: Normal range of motion. Neck supple. No thyromegaly present.   Cardiovascular: Normal rate, regular rhythm, normal heart sounds and intact distal pulses. Exam reveals no gallop and no friction rub.   No murmur heard.  Pulmonary/Chest: Breath sounds normal. No respiratory distress. He has no wheezes. He has no rales.   Abdominal: Soft. Bowel sounds are normal. He exhibits no distension. There is tenderness. There is no rebound and no guarding.   Diffuse tenderness over entire abdomen.   Musculoskeletal: Normal range of motion. He exhibits no edema or tenderness.   Lymphadenopathy:     He has no cervical adenopathy.   Neurological: He is alert and oriented to person, place, and time.   Skin: Skin is warm and dry. No rash noted. He is not diaphoretic.   Psychiatric: He has a normal mood and affect. His behavior is normal. Judgment and thought content normal.   Nursing note and vitals reviewed.       MDM  Number of Diagnoses or Management Options  Acute viral syndrome:   Non-intractable cyclical vomiting, presence of nausea not specified:   Diagnosis management comments: 37 y.o. male presents to ED complaining of nausea and vomiting. Also complains of right wrist pain after punching a wall.    Plan:   CBC, CMP, UA, Lipase, PT/INR, PTT, Influenza A&B  Abd/Pelv CT, RT Wrist XR, RT Hand XR  Zofran,Toradol  -----  5:04 PM  Documented by Frann Rider, acting as a scribe for Dr. Jean Rosenthal, Caleen Jobs, MD      PROVIDER ATTESTATION:   9:15 PM    The entirety of this note, signed by me, accurately reflects all works, treatments, procedures, and medical decision making performed by me, Juliann Mule, MD.            Patient Progress  Patient progress: stable         Procedures      Diagnostic Studies:    ABD/PELV CT Scan 5:05 PM  []  Head []  C-spine []  Chest  [x]   Abd/Pelvis  []   Other:   [x]  Viewed by me   []  Interpreted by me  [x]  No acute disease  []  Abnormal: see below []   Discussed with Radiologist     Interpretation: Unremarkable CT of the abdomen and pelvis, given limitations of an unenhanced CT.    RT WRIST XRAY 5:12 PM    []  Left [x]  Right    []  Fingers  []  Hand  [x]  Wrist  []  Forearm  []  Elbow  []  Humerus  []  Shoulder    []  Toes  []  Foot  []  Ankle  []  Tib/fib  []  Knee  []  Femur  []  Hip  []  Pelvis   []  Cervical Spine  []  Thoracic Spine  []  Lumbar Spine  []  Sacrum  []  Ribs        [x]  Viewed by me    [x]  Interpreted by me  [x]  No fracture/dislocation  []  Abnormal : see below   []  Discussed with       Radiologist    Interpretation: No body abnormalities noted. No fracture.    RT HAND XRAY 5:12 PM    []  Left [x]  Right    []  Fingers  [x]  Hand  []  Wrist  []  Forearm  []  Elbow  []  Humerus  []  Shoulder    []  Toes  []  Foot  []  Ankle  []  Tib/fib  []  Knee  []  Femur  []  Hip  []  Pelvis   []  Cervical Spine  []  Thoracic Spine  []  Lumbar Spine  []  Sacrum  []  Ribs        [x]  Viewed by me    [x]  Interpreted by me  [x]  No fracture/dislocation  []  Abnormal : see below   []  Discussed with       Radiologist    Interpretation: No bony abnormalities noted. No fracture.      Lab Data:  Labs Reviewed   METABOLIC PANEL, COMPREHENSIVE - Abnormal; Notable for the following components:       Result Value    Glucose 110 (*)     BUN 21 (*)     BUN/Creatinine ratio 23 (*)     Protein, total 8.6 (*)     All other components within normal limits   CBC WITH AUTOMATED DIFF - Abnormal; Notable for the following components:    RBC 5.63 (*)     HCT 45.7 (*)      MCH 26.6 (*)     MPV 10.5 (*)     ABS. MONOCYTES 0.3 (*)     All other components within normal limits   INFLUENZA A & B AG (RAPID TEST)   LIPASE   PROTHROMBIN TIME + INR   PTT   URINALYSIS W/ RFLX MICROSCOPIC         ED Course:     8:04 PM  Patient given a P. O. challlenge

## 2017-06-27 MED ORDER — IBUPROFEN 600 MG TAB
600 mg | ORAL_TABLET | Freq: Three times a day (TID) | ORAL | 0 refills | Status: AC | PRN
Start: 2017-06-27 — End: 2017-07-03

## 2017-06-27 MED ORDER — ONDANSETRON HCL 4 MG TAB
4 mg | ORAL_TABLET | Freq: Three times a day (TID) | ORAL | 0 refills | Status: AC | PRN
Start: 2017-06-27 — End: 2017-07-01

## 2017-06-27 MED FILL — SODIUM CHLORIDE 0.9 % IV: INTRAVENOUS | Qty: 1000

## 2017-06-27 MED FILL — TYLENOL 325 MG TABLET: 325 mg | ORAL | Qty: 2

## 2017-07-05 ENCOUNTER — Inpatient Hospital Stay
Admit: 2017-07-05 | Discharge: 2017-07-06 | Disposition: A | Discharge status: Home / Self Care | Payer: PRIVATE HEALTH INSURANCE | Attending: Pediatric Emergency Medicine

## 2017-07-05 ENCOUNTER — Emergency Department: Admit: 2017-07-06 | Payer: PRIVATE HEALTH INSURANCE | Primary: Family Medicine

## 2017-07-05 DIAGNOSIS — N3001 Acute cystitis with hematuria: Secondary | ICD-10-CM

## 2017-07-05 NOTE — ED Triage Notes (Signed)
Flu like sxs X 1 week n/v X 2 days with non-radiating epigastric pain X 2 days.

## 2017-07-05 NOTE — Progress Notes (Signed)
Spiritual Care Assessment/Progress Notes    Aurelio Jewravis A Andujo [id number]  [id number]    [birth year]  37 y.o.  male    Patient Telephone Number: [phone] (home)   Religious Affiliation: [employer] Knuck[employer]ian   Language: [place] Ponds[place]   Extended Emergency Contact Information  Primary Emergency Contact: Polyakov,Vanessa  [facility]  Home Phone: [phone]  Relation: Mother   Patient Active Problem List    Diagnosis Date Noted   ??? Mood disorder (HCC) 09/13/2015   ??? 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(HCC) 09/13/2015   ??? Hyperammonemia (HCC) 09/13/2015   ??? Essential hypertension 09/12/2015   ??? Mild intermittent asthma without complication 09/12/2015   ??? Cigarette nicotine dependence with withdrawal 09/12/2015   ??? Marital relationship problem 09/12/2015        Date: 07/06/2017       Level of Religious/Spiritual Activity:  []          Involved in faith tradition/spiritual practice    []          Not involved in faith tradition/spiritual practice  []          Spiritually oriented    []          Claims no spiritual orientation    []          seeking spiritual identity  []          Feels alienated from religious practice/tradition  []          Feels angry about religious practice/tradition  [x]          Spirituality/religious tradition IS a CounsellorGENERAL resource for coping at this time.  []          Not able to assess due to medical condition    Services Provided Today:  []          crisis intervention    []          reading Scriptures  []          spiritual assessment    []          prayer  [x]          empathic listening/emotional support  []          rites and rituals (cite in comments)  []          life review     []          religious support  []          theological development   []          advocacy  []          ethical dialog     []          blessing  []          bereavement support    []          support to family  []          anticipatory grief support   []          help with AMD  []          spiritual guidance    []          meditation       Spiritual Care Needs  [x]          Emotional Support  [x]          Spiritual/Religious Care  []          Loss/Adjustment  []          Advocacy/Referral                /  Ethics  []          No needs expressed at               this time  []          Other: (note in               comments)  Spiritual Care Plan  []          Follow up visits with               pt/family  []          Provide materials  []          Schedule sacraments  []          Contact Community               Clergy  [x]          Follow up as needed  []          Other: (note in               comments)     Comments: Pt decline any specifically spiritual care but thanked chaplain for visit.     Chaplain Laveda Norman, PhD  Spiritual Care Department  Phone: [phone]   Pager: [phone]

## 2017-07-05 NOTE — ED Notes (Shared)
Assumption of Care Note  11:00 PM    The patient was signed out to me and care transferred to me by the outgoing provider Dr. Luther Redo. Brown, MD at the beginning of my shift.    Briefly, the patient is a 37 y.o. year old male presenting with epigastric abdominal pain for the past 1 week.     The following items were pending at the time of sign out and discussed with the outgoing provider:    []  Labs  []  Xrays  []  Ultrasound  CT: []  Head   []  Neck   []  Chest   []  Abd/Pelvis    []  Extremity   []  C,T, or L spine  []  Other:   ---------------------------------------------------------------  []  Psych Eval  []  Sobriety  [x]  Symptomatic improvement  []  Transportation  []  Other:        Plan for disposition is:     []  Discharge   [x]  Likely Discharge []  Admit  []  Likely Admit  []  Pending   []  Transfer or Likely Transfer           []  This patient is in critical condition and was signed out at bedside; a full plan,                 dispo, and discussion of the patient was provided to the oncoming provider.     11:32 PM  Documented by Alver FisherNimasha B Fernando, acting as a scribe for Dr. Judie PetitM. Carmin RichmondIgbani, MD.     PROVIDER ATTESTATION:  ***    Diagnostic Studies:      Lab Data:  Labs Reviewed   METABOLIC PANEL, COMPREHENSIVE - Abnormal; Notable for the following components:       Result Value    Potassium 5.2 (*)     Anion gap 18 (*)     Creatinine 0.41 (*)     BUN/Creatinine ratio 39 (*)     AST (SGOT) 59 (*)     All other components within normal limits   CBC WITH AUTOMATED DIFF - Abnormal; Notable for the following components:    WBC 4.4 (*)     MCV 80.8 (*)     MCH 26.8 (*)     MPV 11.4 (*)     ABS. MONOCYTES 0.5 (*)     All other components within normal limits   LIPASE   URINALYSIS W/ RFLX MICROSCOPIC         ED Course:   12:13 AM  Patient reported blood in urine in the ED but the sample was not visualized by nursing staff. He was provided with a bedside urinal.   Will attempt to call the patient's rehabilitation program to inform them  of the discharge.

## 2017-07-05 NOTE — ED Provider Notes (Signed)
Kevin Coffey is a 37 y.o. male who presents to the ED with complaint of epigastric pain x 1 week. Associated symptoms include fever, inability to tolerate PO (x 2 days), constipation (x 5 days, normally QDay). Denies chills, rhinorrhea, SOB, HA. Pt does not feel pain at the moment, and only reports vomiting, chest pain, and abdominal pain during episodes of emesis. Pt reports at prior hospital visit he was given a prescription ondansetron.  He tried ondansetron today but vomiting persisted. Pt was given ondansetron by EMS PTA, which helped manage his sx. 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 He does report cigarette use of 5-6 cigarettes a day. He denies ETOH use. He reports a temperature last night of 100.69F. Of note, CT abdomen/pelvis performed on 06/26/17 showed no obstruction or indication for surgical intervention.  CT was noted to be sub-optimal for contrast load.       The history is provided by the patient.   Abdominal Pain    This is a new problem. The current episode started more than 1 week ago. The problem occurs constantly. The problem has not changed since onset.The pain is associated with vomiting and eating. The pain is located in the epigastric region. The pain is moderate. Associated symptoms include a fever and vomiting. Pertinent negatives include no diarrhea, no nausea, no dysuria, no frequency, no hematuria and no chest pain. Relieved by: Zofran.        Past Medical History:   Diagnosis Date   ??? Aggressive outburst    ??? Antisocial personality disorder in adult [facility]) 09/13/2015   ??? Anxiety disorder    ??? Asthma    ??? Bipolar affective disorder, current episode severe (HCC) 09/12/2015   ??? Cigarette nicotine dependence with withdrawal 09/12/2015   ??? Depression    ??? Hyperammonemia (HCC) 09/13/2015    Etiology and Chronicity Unknown    ??? Marital relationship problem 09/12/2015   ??? Mood disorder (HCC) 09/13/2015   ??? Psychiatric disorder      Depression       No past surgical history on file.      No family history on file.    Social History     Socioeconomic History   ??? Marital status: MARRIED     Spouse name: Not on file   ??? Number of children: Not on file   ??? Years of education: Not on file   ??? Highest education level: Not on file   Social Needs   ??? Financial resource strain: Not on file   ??? Food insecurity - worry: Not on file   ??? Food insecurity - inability: Not on file   ??? Transportation needs - medical: Not on file   ??? Transportation needs - non-medical: Not on file   Occupational History   ??? Not on file   Tobacco Use   ??? Smoking status: Current Every Day Smoker     Packs/day: 0.50     Years: 20.00     Pack years: 10.00     Types: Cigarettes   ??? Smokeless tobacco: Never Used   Substance and Sexual Activity   ??? Alcohol use: No     Frequency: Never     Comment: last use over 1 yr ago.   ??? Drug use: Yes     Frequency: 1.0 times per week     Types: Marijuana     Comment: last use Feb 05 2017   ??? Sexual activity: Yes     Partners: Female   Other Topics Concern   ??? Not on file   Social History Narrative   ??? Not on file         ALLERGIES: Patient has no known allergies.    Review of Systems   Constitutional: Positive for fever. Negative for chills.   HENT: Negative.  Negative for congestion.    Eyes: Negative.  Negative for visual disturbance.   Respiratory: Negative.  Negative for cough and shortness of breath.    Cardiovascular: Negative.  Negative for chest pain.   Gastrointestinal: Positive for abdominal pain and vomiting. Negative for blood in stool, diarrhea and nausea.   Genitourinary: Negative.  Negative for dysuria, frequency, hematuria and urgency.   Musculoskeletal: Negative.  Negative for joint swelling.   Skin: Negative.  Negative for rash.   Neurological: Negative.  Negative for dizziness and light-headedness.   All other systems reviewed and are negative.      Vitals:    07/05/17 1925   BP: 149/86   Pulse: 67   Resp: 20    Temp: 99.3 ??F (37.4 ??C)            Physical Exam   Constitutional: He is oriented to person, place, and time. He appears well-developed and well-nourished. No distress.   HENT:   Head: Normocephalic and atraumatic.   Eyes: Conjunctivae are normal. Pupils are equal, round, and reactive to light. Right eye exhibits no discharge. Left eye exhibits no discharge. No scleral icterus.   Neck: Normal range of motion. Neck supple. No JVD present.   Cardiovascular: Normal rate, regular rhythm, normal heart sounds and intact distal pulses. Exam reveals no gallop and no friction rub.   No murmur heard.  Pulmonary/Chest: Effort normal and breath sounds normal. No respiratory distress. He has no wheezes. He has no rales.   Abdominal: Soft. Normal appearance and bowel sounds are normal. He exhibits no distension and no mass. There is no tenderness. There is no rebound and no guarding.   Musculoskeletal: Normal range of motion. He exhibits no edema.   Neurological: He is alert and oriented to person, place, and time. No cranial nerve deficit. Coordination normal.   Skin: Skin is warm and dry. No rash noted. He is not diaphoretic.   Psychiatric: He has a normal mood and affect. His behavior is normal.   Nursing note and vitals reviewed.       MDM  Number of Diagnoses or Management Options  Diagnosis management comments: 37 y.o. male with with nonspecific abdominal pain and vomiting. Pt exam is not suggestive of surgical pathology.  Will treat supportively with anti-emetics and PO challeng.      Plan:     Abdominal lab profile  Abdominal xray  -----  8:22 PM  Documented by Jomarie Longslatundun Y Ladele, acting as a scribe for Dr. Manson PasseyBrown, Dianna Rossettieginald M, MD     PROVIDER ATTESTATION:  11:02 PM    The entirety of this note, signed by me, accurately reflects all works, treatments, procedures, and medical decision making performed by me, Koleen Distanceeginald M Antavia Tandy, MD.           Patient Progress  Patient progress: stable         Procedures       Diagnostic Studies:  CT Scan 10:53 PM  []  Head []  C-spine []  Chest  []   Abd/Pelvis  []   Other:   [x]  Viewed by me   []   Interpreted by me  [x]  No acute disease  []  Abnormal: see below []   Discussed with Radiologist     Interpretation: No free air. Moderate stool burden.      Lab Data:  Labs Reviewed   METABOLIC PANEL, COMPREHENSIVE - Abnormal; Notable for the following components:       Result Value    Potassium 5.2 (*)     Anion gap 18 (*)     Creatinine 0.41 (*)     BUN/Creatinine ratio 39 (*)     AST (SGOT) 59 (*)     All other components within normal limits   CBC WITH AUTOMATED DIFF - Abnormal; Notable for the following components:    WBC 4.4 (*)     MCV 80.8 (*)     MCH 26.8 (*)     MPV 11.4 (*)     ABS. MONOCYTES 0.5 (*)     All other components within normal limits   LIPASE   URINALYSIS W/ RFLX MICROSCOPIC         ED Course:      9:40 PM   Pt tolerated ice chips. Giving ginger ale.     11:00 PM    Pt provided tray by nursing and vomiting recurred.     Care transferred to the oncoming provider at the end of my shift, the following items were pending at the time of sign out and discussed with the oncoming provider:    []  Labs  []  Xrays  []  Ultrasound  CT: []  Head   []  Neck   []  Chest   []  Abd/Pelvis    []  Extremity   []  C,T, or L spine  []  Other:   ---------------------------------------------------------------  []  Psych Eval  []  Sobriety  [x]  Symptomatic improvement (PO challenge)  []  Transportation  []  Other:        Plan for disposition is:     []  Discharge   [x]  Likely Discharge []  Admit  []  Likely Admit  []  Pending   []  Transfer or Likely Transfer           []  This patient is in critical condition and was signed out at bedside; a full plan,                 dispo, and discussion of the patient was provided to the oncoming provider.

## 2017-07-06 LAB — CBC WITH AUTOMATED DIFF
ABS. BASOPHILS: 0 10*3/uL (ref 0.0–0.2)
ABS. EOSINOPHILS: 0 10*3/uL (ref 0.0–0.7)
ABS. LYMPHOCYTES: 1.4 10*3/uL (ref 1.2–3.4)
ABS. MONOCYTES: 0.5 10*3/uL — ABNORMAL LOW (ref 1.1–3.2)
ABS. NEUTROPHILS: 2.5 10*3/uL (ref 1.4–6.5)
BASOPHILS: 0 % (ref 0–2)
EOSINOPHILS: 0 % (ref 0–5)
HCT: 41.6 % (ref 36.8–45.2)
HGB: 13.8 g/dL (ref 12.8–15.0)
IMMATURE GRANULOCYTES: 0 % (ref 0.0–5.0)
LYMPHOCYTES: 32 % (ref 16–40)
MCH: 26.8 PG — ABNORMAL LOW (ref 27–31)
MCHC: 33.2 g/dL (ref 32–36)
MCV: 80.8 FL — ABNORMAL LOW (ref 81–99)
MONOCYTES: 10 % (ref 0–12)
MPV: 11.4 FL — ABNORMAL HIGH (ref 7.4–10.4)
NEUTROPHILS: 58 % (ref 40–70)
PLATELET: 218 10*3/uL (ref 140–450)
RBC: 5.15 M/uL (ref 4.0–5.2)
RDW: 14.4 % (ref 11.5–14.5)
WBC: 4.4 10*3/uL — ABNORMAL LOW (ref 4.8–10.8)

## 2017-07-06 LAB — URINALYSIS W/ RFLX MICROSCOPIC
Bilirubin: NEGATIVE
Glucose: NEGATIVE mg/dL
Ketone: NEGATIVE mg/dL
Nitrites: NEGATIVE
Specific gravity: 1.02 (ref 1.003–1.030)
Urobilinogen: 0.2 EU/dL (ref 0.2–1.0)
pH (UA): 6 (ref 5.0–7.0)

## 2017-07-06 LAB — METABOLIC PANEL, COMPREHENSIVE
A-G Ratio: 1 (ref 1.0–3.1)
ALT (SGPT): 30 U/L (ref 12–78)
AST (SGOT): 59 U/L — ABNORMAL HIGH (ref 15–37)
Albumin: 4 g/dL (ref 3.4–5.0)
Alk. phosphatase: 97 U/L (ref 46–116)
Anion gap: 18 mmol/L — ABNORMAL HIGH (ref 10–17)
BUN/Creatinine ratio: 39 — ABNORMAL HIGH (ref 6.0–20.0)
BUN: 16 MG/DL (ref 7–18)
Bilirubin, total: 0.8 MG/DL (ref 0.2–1.0)
CO2: 27 mmol/L (ref 21–32)
Calcium: 9.2 MG/DL (ref 8.5–10.1)
Chloride: 100 mmol/L (ref 98–107)
Creatinine: 0.41 MG/DL — ABNORMAL LOW (ref 0.6–1.3)
GFR est AA: >60 mL/min/{1.73_m2} (ref >60)
GFR est non-AA: >60 mL/min/{1.73_m2} (ref >60)
Globulin: 4.2 g/dL
Glucose: 106 mg/dL (ref 74–106)
Potassium: 5.2 mmol/L — ABNORMAL HIGH (ref 3.5–5.1)
Protein, total: 8.2 g/dL (ref 6.4–8.2)
Sodium: 140 mmol/L (ref 136–145)

## 2017-07-06 LAB — URINE MICROSCOPIC
Casts: NONE SEEN /lpf
Crystals, urine: NONE SEEN /LPF
RBC: 25 /hpf
WBC: 100 /hpf

## 2017-07-06 LAB — LIPASE: Lipase: 81 U/L (ref 65–230)

## 2017-07-06 MED ORDER — SODIUM PHOSPHATES 19 GRAM-7 GRAM/118 ML ENEMA
19-7 gram/118 mL | RECTAL | Status: AC
Start: 2017-07-06 — End: 2017-07-05
  Administered 2017-07-06: 04:00:00 via RECTAL

## 2017-07-06 MED ORDER — SODIUM CHLORIDE 0.9% BOLUS IV
0.9 % | Freq: Once | INTRAVENOUS | Status: AC
Start: 2017-07-06 — End: 2017-07-06
  Administered 2017-07-06: 04:00:00 via INTRAVENOUS

## 2017-07-06 MED ORDER — CEFTRIAXONE 250 MG SOLUTION FOR INJECTION
250 mg | INTRAMUSCULAR | Status: DC
Start: 2017-07-06 — End: 2017-07-06

## 2017-07-06 MED ORDER — ONDANSETRON (PF) 4 MG/2 ML INJECTION
4 mg/2 mL | INTRAMUSCULAR | Status: AC
Start: 2017-07-06 — End: 2017-07-05
  Administered 2017-07-06: 03:00:00 via INTRAVENOUS

## 2017-07-06 MED ORDER — CIPROFLOXACIN 500 MG TAB
500 mg | ORAL_TABLET | Freq: Two times a day (BID) | ORAL | 0 refills | Status: AC
Start: 2017-07-06 — End: 2017-07-16

## 2017-07-06 MED ORDER — AZITHROMYCIN 250 MG TAB
250 mg | ORAL | Status: DC
Start: 2017-07-06 — End: 2017-07-06

## 2017-07-06 MED FILL — FLEET ENEMA 19 GRAM-7 GRAM/118 ML: 19-7 gram/118 mL | RECTAL | Qty: 133

## 2017-07-06 MED FILL — ONDANSETRON (PF) 4 MG/2 ML INJECTION: 4 mg/2 mL | INTRAMUSCULAR | Qty: 2

## 2017-07-06 MED FILL — CEFTRIAXONE 250 MG SOLUTION FOR INJECTION: 250 mg | INTRAMUSCULAR | Qty: 250

## 2017-07-06 MED FILL — SODIUM CHLORIDE 0.9 % IV: INTRAVENOUS | Qty: 1000

## 2017-07-06 MED FILL — AZITHROMYCIN 250 MG TAB: 250 mg | ORAL | Qty: 4

## 2017-07-06 NOTE — ED Notes (Signed)
Fleet's enema administered without incident.  IV fluid bolus infusing.

## 2017-07-06 NOTE — ED Notes (Signed)
Patient called from restroom to report that "blood is coming out my penis."  Patient given urinal to collect specimen the next time he has to void.

## 2017-07-08 LAB — CHLAMYDIA / GC-AMPLIFIED
Chlamydia trachomatis, NAA: NEGATIVE
Neisseria gonorrhoeae, NAA: NEGATIVE

## 2021-06-01 ENCOUNTER — Other Ambulatory Visit: Payer: Self-pay

## 2021-06-01 ENCOUNTER — Encounter (HOSPITAL_COMMUNITY): Payer: Self-pay

## 2021-06-01 ENCOUNTER — Emergency Department (HOSPITAL_COMMUNITY)
Admission: EM | Admit: 2021-06-01 | Discharge: 2021-06-01 | Disposition: A | Discharge status: Home / Self Care | Payer: Medicaid - Out of State | Attending: Emergency Medicine | Admitting: Emergency Medicine

## 2021-06-01 ENCOUNTER — Emergency Department (HOSPITAL_COMMUNITY): Payer: Medicaid - Out of State

## 2021-06-01 DIAGNOSIS — R1084 Generalized abdominal pain: Secondary | ICD-10-CM

## 2021-06-01 DIAGNOSIS — R1013 Epigastric pain: Secondary | ICD-10-CM | POA: Diagnosis present

## 2021-06-01 DIAGNOSIS — K439 Ventral hernia without obstruction or gangrene: Secondary | ICD-10-CM | POA: Diagnosis not present

## 2021-06-01 LAB — CBC
HCT: 42.2 % (ref 39.0–52.0)
Hemoglobin: 14.7 g/dL (ref 13.0–17.0)
MCH: 28.2 pg (ref 26.0–34.0)
MCHC: 34.8 g/dL (ref 30.0–36.0)
MCV: 80.8 fL (ref 80.0–100.0)
Platelets: 208 10*3/uL (ref 150–400)
RBC: 5.22 MIL/uL (ref 4.22–5.81)
RDW: 13.8 % (ref 11.5–15.5)
WBC: 9.1 10*3/uL (ref 4.0–10.5)
nRBC: 0 % (ref 0.0–0.2)

## 2021-06-01 LAB — URINALYSIS, ROUTINE W REFLEX MICROSCOPIC
Bilirubin Urine: NEGATIVE
Glucose, UA: NEGATIVE mg/dL
Ketones, ur: NEGATIVE mg/dL
Leukocytes,Ua: NEGATIVE
Nitrite: NEGATIVE
Protein, ur: 30 mg/dL — AB
Specific Gravity, Urine: 1.026 (ref 1.005–1.030)
pH: 5 (ref 5.0–8.0)

## 2021-06-01 LAB — COMPREHENSIVE METABOLIC PANEL
ALT: 13 U/L (ref 0–44)
AST: 17 U/L (ref 15–41)
Albumin: 3.7 g/dL (ref 3.5–5.0)
Alkaline Phosphatase: 85 U/L (ref 38–126)
Anion gap: 10 (ref 5–15)
BUN: 15 mg/dL (ref 6–20)
CO2: 27 mmol/L (ref 22–32)
Calcium: 9 mg/dL (ref 8.9–10.3)
Chloride: 101 mmol/L (ref 98–111)
Creatinine, Ser: 0.77 mg/dL (ref 0.61–1.24)
GFR, Estimated: >60 mL/min (ref >60)
Glucose, Bld: 107 mg/dL — ABNORMAL HIGH (ref 70–99)
Potassium: 2.8 mmol/L — ABNORMAL LOW (ref 3.5–5.1)
Sodium: 138 mmol/L (ref 135–145)
Total Bilirubin: 0.6 mg/dL (ref 0.3–1.2)
Total Protein: 7.4 g/dL (ref 6.5–8.1)

## 2021-06-01 LAB — LIPASE, BLOOD: Lipase: 32 U/L (ref 11–51)

## 2021-06-01 MED ORDER — IOHEXOL 300 MG/ML  SOLN
100.0000 mL | Freq: Once | INTRAMUSCULAR | Status: AC | PRN
  Administered 2021-06-01: 100 mL via INTRAVENOUS

## 2021-06-01 MED ORDER — ONDANSETRON HCL 4 MG PO TABS: 4.0000 mg | ORAL_TABLET | Freq: Four times a day (QID) | ORAL | 0 refills | Status: DC

## 2021-06-01 MED ORDER — POTASSIUM CHLORIDE CRYS ER 20 MEQ PO TBCR
40.0000 meq | EXTENDED_RELEASE_TABLET | Freq: Once | ORAL | Status: AC
Start: 2021-06-01 — End: 2021-06-01
  Administered 2021-06-01: 40 meq via ORAL
  Filled 2021-06-01: qty 2

## 2021-06-01 MED ORDER — LIDOCAINE VISCOUS HCL 2 % MT SOLN
15.0000 mL | Freq: Once | OROMUCOSAL | Status: AC
  Administered 2021-06-01: 15 mL via ORAL
  Filled 2021-06-01: qty 15

## 2021-06-01 MED ORDER — POLYETHYLENE GLYCOL 3350 17 GM/SCOOP PO POWD: 1.0000 | Freq: Once | ORAL | 0 refills | Status: AC

## 2021-06-01 MED ORDER — ALUM & MAG HYDROXIDE-SIMETH 200-200-20 MG/5ML PO SUSP
30.0000 mL | Freq: Once | ORAL | Status: AC
  Administered 2021-06-01: 30 mL via ORAL
  Filled 2021-06-01: qty 30

## 2021-06-01 MED ORDER — POTASSIUM CHLORIDE CRYS ER 20 MEQ PO TBCR
40.0000 meq | EXTENDED_RELEASE_TABLET | Freq: Once | ORAL | Status: AC
  Administered 2021-06-01: 40 meq via ORAL
  Filled 2021-06-01: qty 2

## 2021-06-01 MED ORDER — DICYCLOMINE HCL 20 MG PO TABS: 20.0000 mg | ORAL_TABLET | Freq: Two times a day (BID) | ORAL | 0 refills | Status: DC

## 2021-06-01 NOTE — ED Triage Notes (Signed)
Patient complains of 1 week of abdominal pain with vomiting after any intake. Reports loose stools with same. Patient reports burning to abdomen. No ETOH

## 2021-06-01 NOTE — ED Provider Notes (Signed)
[facility] EMERGENCY DEPARTMENT Provider Note   CSN: [id number] Arrival date & time: 06/01/21  8295     History  Chief Complaint  Patient presents with   Abdominal Pain    Derek Malone is a 41 y.o. male.  He presents today with abdominal pain that has been ongoing for the last week.  He states he has not had a bowel movement in approximately 1 week.  He has been passing gas he reports.  He states his pain is mostly left-sided and epigastric.  Mild to moderate in severity.  Also complains of some burning sensation.  He has had associated nausea and vomiting.   Home Medications Prior to Admission medications   Medication Sig Start Date End Date Taking? Authorizing Provider  dicyclomine (BENTYL) 20 MG tablet Take 1 tablet (20 mg total) by mouth 2 (two) times daily. 06/01/21  Yes Edison Simon, MD  ondansetron (ZOFRAN) 4 MG tablet Take 1 tablet (4 mg total) by mouth every 6 (six) hours. 06/01/21  Yes Edison Simon, MD  polyethylene glycol powder (GLYCOLAX/MIRALAX) 17 GM/SCOOP powder Take 255 g by mouth once for 1 dose. 06/01/21 06/01/21 Yes Edison Simon, MD      Allergies    Patient has no known allergies.    Review of Systems   Review of Systems  Constitutional:  Negative for chills and fever.  HENT:  Negative for ear pain and sore throat.   Eyes:  Negative for pain and visual disturbance.  Respiratory:  Negative for cough and shortness of breath.   Cardiovascular:  Negative for chest pain and palpitations.  Gastrointestinal:  Positive for abdominal pain and constipation. Negative for vomiting.  Genitourinary:  Negative for dysuria and hematuria.  Musculoskeletal:  Negative for arthralgias and back pain.  Skin:  Negative for color change and rash.  Neurological:  Negative for seizures and syncope.  All other systems reviewed and are negative.  Physical Exam Updated Vital Signs BP 131/63 (BP Location: Right Arm)    Pulse (!) 58    Temp 97.9 F  (36.6 C) (Oral)    Resp 15    Ht 5' 7.5" (1.715 m)    Wt 73.9 kg    SpO2 100%    BMI 25.15 kg/m  Physical Exam Vitals and nursing note reviewed.  Constitutional:      General: He is not in acute distress.    Appearance: He is well-developed.  HENT:     Head: Normocephalic and atraumatic.  Eyes:     Conjunctiva/sclera: Conjunctivae normal.  Cardiovascular:     Rate and Rhythm: Normal rate and regular rhythm.     Heart sounds: No murmur heard. Pulmonary:     Effort: Pulmonary effort is normal. No respiratory distress.     Breath sounds: Normal breath sounds.  Abdominal:     Palpations: Abdomen is soft.     Tenderness: There is abdominal tenderness in the epigastric area and left upper quadrant. There is no guarding or rebound. Negative signs include Murphy's sign.  Musculoskeletal:        General: No swelling.     Cervical back: Neck supple.  Skin:    General: Skin is warm and dry.     Capillary Refill: Capillary refill takes less than 2 seconds.  Neurological:     Mental Status: He is alert.  Psychiatric:        Mood and Affect: Mood normal.    ED Results / Procedures / Treatments  Labs (all labs ordered are listed, but only abnormal results are displayed) Labs Reviewed  COMPREHENSIVE METABOLIC PANEL - Abnormal; Notable for the following components:      Result Value   Potassium 2.8 (*)    Glucose, Bld 107 (*)    All other components within normal limits  URINALYSIS, ROUTINE W REFLEX MICROSCOPIC - Abnormal; Notable for the following components:   APPearance HAZY (*)    Hgb urine dipstick MODERATE (*)    Protein, ur 30 (*)    Bacteria, UA RARE (*)    All other components within normal limits  LIPASE, BLOOD  CBC    EKG None  Radiology CT ABDOMEN PELVIS W CONTRAST  Result Date: 06/01/2021 CLINICAL DATA:  Acute abdominal pain nonlocalized. One week of abdominal pain with vomiting. Loose stools. Burning to abdomen. EXAM: CT ABDOMEN AND PELVIS WITH CONTRAST  TECHNIQUE: Multidetector CT imaging of the abdomen and pelvis was performed using the standard protocol following bolus administration of intravenous contrast. RADIATION DOSE REDUCTION: This exam was performed according to the departmental dose-optimization program which includes automated exposure control, adjustment of the mA and/or kV according to patient size and/or use of iterative reconstruction technique. CONTRAST:  100mL OMNIPAQUE IOHEXOL 300 MG/ML  SOLN COMPARISON:  CT chest, abdomen and pelvis 10/02/2010 FINDINGS: Lower chest: Lung bases are unremarkable. Hepatobiliary: Smooth liver contours. No focal liver mass is identified. The gallbladder is unremarkable. No intrahepatic or extrahepatic biliary ductal dilatation. Pancreas: No mass or inflammatory fat stranding. No pancreatic ductal dilatation is seen. Spleen: Normal in size without focal abnormality. Adrenals/Urinary Tract: Adrenal glands are unremarkable. The kidneys enhance uniformly and are symmetric in size without hydronephrosis. No renal stone is seen. Tiny subcentimeter low-attenuation lesion within the posterior left kidney (axial image 25) is slightly increased from 11/01/2010 remote prior CT but remains too small to further characterize. This statistically most likely represents a cyst. The urinary bladder is only minimally distended, limiting evaluation. Stomach/Bowel: There is new surgical suture within the region of the ascending colon possibly a partial ascending colectomy with ileocolic anastomosis. No dilated loops of bowel to indicate bowel obstruction. Vascular/Lymphatic: No abdominal aortic aneurysm. No mesenteric, retroperitoneal, or pelvic lymphadenopathy. Reproductive: The prostate and seminal vesicles are grossly unremarkable. Other: There is a small ventral left hemiabdomen ventral hernia measuring up to approximately 8 mm in transverse dimension and 10 mm in craniocaudal dimension (axial image 44 and sagittal image 104) with  ventral herniated fat measuring up to approximately 3.7 by 1.4 by 3.4 cm (transverse by AP by craniocaudal). There is mild stranding within the herniated fat.No abdominopelvic ascites. No pneumoperitoneum. Musculoskeletal: Mild posterior L5-S1 disc space narrowing. IMPRESSION:: IMPRESSION: 1. There is a left ventral abdominal fat containing hernia in between the junction of the superior and inferior left rectus abdominis musculature, with the herniated fat measuring up to 3.7 cm. There is mild associated stranding within the herniated fat suggesting mild inflammatory change. No bowel involvement. 2. Compared to remote 11/01/2010 CT, there appears to be partial right colon resection. No bowel obstruction. Electronically Signed   By: Neita Garnetonald  Viola M.D.   On: 06/01/2021 10:47    Procedures Procedures    Medications Ordered in ED Medications  potassium chloride SA (KLOR-CON M) CR tablet 40 mEq (40 mEq Oral Given 06/01/21 1126)  iohexol (OMNIPAQUE) 300 MG/ML solution 100 mL (100 mLs Intravenous Contrast Given 06/01/21 1027)  potassium chloride SA (KLOR-CON M) CR tablet 40 mEq (40 mEq Oral Given 06/01/21 1125)  alum &  mag hydroxide-simeth (MAALOX/MYLANTA) 200-200-20 MG/5ML suspension 30 mL (30 mLs Oral Given 06/01/21 1126)    And  lidocaine (XYLOCAINE) 2 % viscous mouth solution 15 mL (15 mLs Oral Given 06/01/21 1126)    ED Course/ Medical Decision Making/ A&P  Aram Candela presented today with abdominal pain. Their presentation is complicated by their history of appendicitis.  Differential diagnosis includes but is not limited to bowel obstruction, ileus, intraabdominal abscess.  Based on the presentation, labs and imaging were ordered.   ED provider interpretation of laboratory studies: Lab results are reassuring.  No elevation in lipase, no further normalities, no leukocytosis.  ED provider interpretation of imaging studies (imaging also reviewed and interpreted by radiology): CT scan did  not show any evidence of bowel obstruction or other intra-abdominal pathologies.  Decision Making: Patient presents with abdominal pain.  He does have a history of abdominal surgeries.  He states he has not been having bowel movements or passing gas.  However, CT scan was negative for any signs of ileus or bowel obstruction.  He has tolerated p.o. here in the ED.  Etiology of his pain is unclear however, does not appear that he has acute infection or surgical process.  Will discharge home with stool softeners and Bentyl for abdominal cramping.  Discussed the importance of close outpatient follow-up.  Discussed return precautions including fevers, worsening pain.  Patient voiced understanding and agreement with this plan.   Patient seen in conjunction with my attending, Dr. Tomi Bamberger.     Final Clinical Impression(s) / ED Diagnoses Final diagnoses:  Generalized abdominal pain    Rx / DC Orders ED Discharge Orders          Ordered    ondansetron (ZOFRAN) 4 MG tablet  Every 6 hours        06/01/21 1105    dicyclomine (BENTYL) 20 MG tablet  2 times daily        06/01/21 1108    polyethylene glycol powder (GLYCOLAX/MIRALAX) 17 GM/SCOOP powder   Once        06/01/21 1108              Jacelyn Pi, MD 06/01/21 1516    Dorie Rank, MD 06/01/21 1551

## 2021-06-01 NOTE — ED Notes (Signed)
Reviewed discharge instructions with patient and significant other. Follow-up care and medications reviewed. Patient and significant other verbalized understanding. Patient A&Ox4, VSS, and ambulatory with steady gait upon discharge.  °

## 2021-06-01 NOTE — ED Provider Triage Note (Signed)
Emergency Medicine Provider Triage Evaluation Note  Derek Malone , a 41 y.o. male  was evaluated in triage.  Pt complains of left upper quadrant abdominal pain with nausea, vomiting, and constipation gradually worsening the past week.  Patient reports he has not had a bowel movement in over a week.  He denies any coffee-ground emesis or any hematemesis.  He reports he is urinating at least 3-4 times a day but is unable to keep down any food or fluid.  He reports this feels like it did when he had appendicitis although he had an appendectomy he denies any fevers.  Denies any urinary symptoms.  Review of Systems  Positive: Abdominal pain, constipation, nausea, vomiting Negative: Chest pain, shortness of breath, diarrhea, fever  Physical Exam  BP 128/89    Pulse 79    Temp 98.5 F (36.9 C) (Oral)    Resp 20    SpO2 98%  Gen:   Awake, no distress   Resp:  Normal effort  MSK:   Moves extremities without difficulty  Other:  LUQ tenderness to palpation. NBS. Soft.   Medical Decision Making  Medically screening exam initiated at 9:22 AM.  Appropriate orders placed.  Derek Malone was informed that the remainder of the evaluation will be completed by another provider, this initial triage assessment does not replace that evaluation, and the importance of remaining in the ED until their evaluation is complete.  Labs and imaging ordered   Achille Rich, [place] 06/01/21 8144

## 2021-06-01 NOTE — ED Notes (Signed)
Pt transported to CT at this time.

## 2021-09-16 ENCOUNTER — Emergency Department (HOSPITAL_COMMUNITY)
Admission: EM | Admit: 2021-09-16 | Discharge: 2021-09-16 | Discharge status: Against Medical Advice | Payer: Medicaid - Out of State

## 2021-09-16 NOTE — ED Notes (Signed)
Pt called x4 for triage, no response.  

## 2021-09-16 NOTE — ED Notes (Signed)
Pt did not answer for triage. 

## 2021-10-02 ENCOUNTER — Emergency Department (HOSPITAL_COMMUNITY)
Admission: EM | Admit: 2021-10-02 | Discharge: 2021-10-02 | Discharge status: Against Medical Advice | Payer: Medicaid - Out of State | Attending: Emergency Medicine | Admitting: Emergency Medicine

## 2021-10-02 ENCOUNTER — Encounter (HOSPITAL_COMMUNITY): Payer: Self-pay

## 2021-10-02 DIAGNOSIS — Z5321 Procedure and treatment not carried out due to patient leaving prior to being seen by health care provider: Secondary | ICD-10-CM | POA: Insufficient documentation

## 2021-10-02 DIAGNOSIS — M62830 Muscle spasm of back: Secondary | ICD-10-CM | POA: Diagnosis present

## 2021-10-02 NOTE — ED Notes (Signed)
Registration informed this NT that the patient handed them their patient labels and stated they were leaving

## 2021-10-02 NOTE — ED Triage Notes (Signed)
Pt comes from home via [facility] for L sided lower back spasms that has been going on for the past hour, denies injury.

## 2021-11-28 ENCOUNTER — Emergency Department (HOSPITAL_COMMUNITY)
Admission: EM | Admit: 2021-11-28 | Discharge: 2021-11-28 | Disposition: A | Discharge status: Home / Self Care | Payer: Medicaid - Out of State | Attending: Student | Admitting: Student

## 2021-11-28 ENCOUNTER — Encounter (HOSPITAL_COMMUNITY): Payer: Self-pay | Admitting: Emergency Medicine

## 2021-11-28 ENCOUNTER — Other Ambulatory Visit: Payer: Self-pay

## 2021-11-28 ENCOUNTER — Emergency Department (HOSPITAL_COMMUNITY): Payer: Medicaid - Out of State

## 2021-11-28 DIAGNOSIS — S51812A Laceration without foreign body of left forearm, initial encounter: Secondary | ICD-10-CM | POA: Diagnosis not present

## 2021-11-28 DIAGNOSIS — S0191XA Laceration without foreign body of unspecified part of head, initial encounter: Secondary | ICD-10-CM | POA: Diagnosis not present

## 2021-11-28 DIAGNOSIS — S0990XA Unspecified injury of head, initial encounter: Secondary | ICD-10-CM | POA: Diagnosis present

## 2021-11-28 DIAGNOSIS — Z5321 Procedure and treatment not carried out due to patient leaving prior to being seen by health care provider: Secondary | ICD-10-CM | POA: Insufficient documentation
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
## 2021-12-06 ENCOUNTER — Emergency Department (HOSPITAL_COMMUNITY)
Admission: EM | Admit: 2021-12-06 | Discharge: 2021-12-06 | Discharge status: Against Medical Advice | Payer: Medicaid - Out of State | Source: Home / Self Care

## 2022-09-08 ENCOUNTER — Emergency Department (HOSPITAL_COMMUNITY): Payer: BLUE CROSS/BLUE SHIELD

## 2022-09-08 ENCOUNTER — Encounter (HOSPITAL_COMMUNITY): Payer: Self-pay

## 2022-09-08 ENCOUNTER — Emergency Department (HOSPITAL_COMMUNITY)
Admission: EM | Admit: 2022-09-08 | Discharge: 2022-09-08 | Disposition: A | Discharge status: Home / Self Care | Payer: BLUE CROSS/BLUE SHIELD | Attending: Emergency Medicine | Admitting: Emergency Medicine

## 2022-09-08 ENCOUNTER — Other Ambulatory Visit: Payer: Self-pay

## 2022-09-08 ENCOUNTER — Ambulatory Visit (HOSPITAL_COMMUNITY)
Admission: EM | Admit: 2022-09-08 | Discharge: 2022-09-08 | Disposition: A | Discharge status: Home / Self Care | Payer: BLUE CROSS/BLUE SHIELD | Attending: Family Medicine | Admitting: Family Medicine

## 2022-09-08 DIAGNOSIS — R221 Localized swelling, mass and lump, neck: Secondary | ICD-10-CM | POA: Diagnosis present

## 2022-09-08 DIAGNOSIS — T797XXA Traumatic subcutaneous emphysema, initial encounter: Secondary | ICD-10-CM

## 2022-09-08 DIAGNOSIS — Y838 Other surgical procedures as the cause of abnormal reaction of the patient, or of later complication, without mention of misadventure at the time of the procedure: Secondary | ICD-10-CM | POA: Insufficient documentation

## 2022-09-08 DIAGNOSIS — T8182XA Emphysema (subcutaneous) resulting from a procedure, initial encounter: Secondary | ICD-10-CM | POA: Diagnosis not present

## 2022-09-08 DIAGNOSIS — J982 Interstitial emphysema: Secondary | ICD-10-CM

## 2022-09-08 LAB — CBC WITH DIFFERENTIAL/PLATELET
Abs Immature Granulocytes: 0.02 10*3/uL (ref 0.00–0.07)
Basophils Absolute: 0 10*3/uL (ref 0.0–0.1)
Basophils Relative: 0 %
Eosinophils Absolute: 0.1 10*3/uL (ref 0.0–0.5)
Eosinophils Relative: 1 %
HCT: 42.9 % (ref 39.0–52.0)
Hemoglobin: 14.6 g/dL (ref 13.0–17.0)
Immature Granulocytes: 0 %
Lymphocytes Relative: 20 %
Lymphs Abs: 1.8 10*3/uL (ref 0.7–4.0)
MCH: 27.5 pg (ref 26.0–34.0)
MCHC: 34 g/dL (ref 30.0–36.0)
MCV: 80.8 fL (ref 80.0–100.0)
Monocytes Absolute: 0.5 10*3/uL (ref 0.1–1.0)
Monocytes Relative: 5 %
Neutro Abs: 6.5 10*3/uL (ref 1.7–7.7)
Neutrophils Relative %: 74 %
Platelets: 257 10*3/uL (ref 150–400)
RBC: 5.31 MIL/uL (ref 4.22–5.81)
RDW: 14.6 % (ref 11.5–15.5)
WBC: 8.9 10*3/uL (ref 4.0–10.5)
nRBC: 0 % (ref 0.0–0.2)

## 2022-09-08 LAB — COMPREHENSIVE METABOLIC PANEL
ALT: 14 U/L (ref 0–44)
AST: 15 U/L (ref 15–41)
Albumin: 4 g/dL (ref 3.5–5.0)
Alkaline Phosphatase: 100 U/L (ref 38–126)
Anion gap: 11 (ref 5–15)
BUN: 15 mg/dL (ref 6–20)
CO2: 25 mmol/L (ref 22–32)
Calcium: 9.5 mg/dL (ref 8.9–10.3)
Chloride: 101 mmol/L (ref 98–111)
Creatinine, Ser: 0.86 mg/dL (ref 0.61–1.24)
GFR, Estimated: >60 mL/min (ref >60)
Glucose, Bld: 97 mg/dL (ref 70–99)
Potassium: 3.3 mmol/L — ABNORMAL LOW (ref 3.5–5.1)
Sodium: 137 mmol/L (ref 135–145)
Total Bilirubin: 1 mg/dL (ref 0.3–1.2)
Total Protein: 8.6 g/dL — ABNORMAL HIGH (ref 6.5–8.1)

## 2022-09-08 LAB — LACTIC ACID, PLASMA: Lactic Acid, Venous: 1.2 mmol/L (ref 0.5–1.9)

## 2022-09-08 MED ORDER — ACETAMINOPHEN 500 MG PO TABS: 1000.0000 mg | ORAL_TABLET | Freq: Once | ORAL | Status: DC

## 2022-09-08 MED ORDER — PIPERACILLIN-TAZOBACTAM 3.375 G IVPB 30 MIN
3.3750 g | Freq: Once | INTRAVENOUS | Status: AC
  Administered 2022-09-08: 3.375 g via INTRAVENOUS
  Filled 2022-09-08: qty 50

## 2022-09-08 MED ORDER — AMOXICILLIN-POT CLAVULANATE 875-125 MG PO TABS: 1.0000 | ORAL_TABLET | Freq: Two times a day (BID) | ORAL | 0 refills | Status: DC

## 2022-09-08 MED ORDER — LACTATED RINGERS IV BOLUS
1000.0000 mL | Freq: Once | INTRAVENOUS | Status: AC
  Administered 2022-09-08: 1000 mL via INTRAVENOUS

## 2022-09-08 MED ORDER — IOHEXOL 350 MG/ML SOLN
75.0000 mL | Freq: Once | INTRAVENOUS | Status: AC | PRN
  Administered 2022-09-08: 75 mL via INTRAVENOUS

## 2022-09-08 MED ORDER — CLINDAMYCIN PHOSPHATE 600 MG/50ML IV SOLN
600.0000 mg | Freq: Once | INTRAVENOUS | Status: AC
  Administered 2022-09-08: 600 mg via INTRAVENOUS
  Filled 2022-09-08: qty 50

## 2022-09-08 MED ORDER — VANCOMYCIN HCL IN DEXTROSE 1-5 GM/200ML-% IV SOLN
1000.0000 mg | Freq: Once | INTRAVENOUS | Status: AC
  Administered 2022-09-08: 1000 mg via INTRAVENOUS
  Filled 2022-09-08: qty 200

## 2022-09-08 NOTE — Discharge Instructions (Addendum)
Follow-up outpatient with a primary care provider. The recommendations of the surgeons and inpatient doctors were that you be admitted for observation. We will send a course of Augmentin to your pharmacy. CT Chest: IMPRESSION:  1. Pneumomediastinum and subcutaneous emphysema extending from the  neck. Pneumomediastinum is predominantly in the superior  mediastinum. Subcutaneous emphysema extends from the neck into the  subpectoral and axillary regions bilaterally as well as in the  posterior paraspinous soft tissues and upper back regions. No  associated fluid collections. The trachea and esophagus appear  normally patent and positioned.  2. 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     CT Neck: IMPRESSION:  Extensive subcutaneous emphysema involving the superficial and deep  soft tissues of the neck extending superiorly to the level of the  right masticator space and inferiorly to the visualized mediastinum.  Subcutaneous air is also seen in the bilateral axillary soft  tissues. No evidence of an odontogenic soft tissue abscess. No large  dental or periapical lucency is visualized. Findings may be  secondary to [facility] effect if there is a recent history of  increased intrathoracic pressure. Recommend further evaluation with  a chest CT.

## 2022-09-08 NOTE — ED Triage Notes (Addendum)
Pt reports neck swelling since having 3 wisdom teeth extracted 2 days ago; pt states he's spitting up "green stuff", but then remembered he was having green nasal discharge prior to tooth extraction. States his neck is very painful and hurts to turn his head. Denies any known fevers. States he has been able to drink PO fluids; describes "when I'm laying down, it's like something gets stuck in my throat, and then I bring up thick green stuff". Has been using a Rx oral rinse, IBU, and another pain med. States he placed call to oral surgeon today - was told to stop all meds and go to urgent care.

## 2022-09-08 NOTE — ED Notes (Signed)
Patient is being discharged from the Urgent Care and sent to the Emergency Department via private vehicle with friend/family member . Per Dr Marlinda Mike, patient is in need of higher level of care due to neck swelling S/P wisdom tooth extraction. Patient is aware and verbalizes understanding of plan of care.  Vitals:   09/08/22 1538  BP: 136/88  Pulse: 93  Resp: 18  Temp: 99 F (37.2 C)  SpO2: 100%

## 2022-09-08 NOTE — ED Triage Notes (Signed)
Pt to er, pt states that Monday he had 4 wisdom teeth pulled, states that when he woke up that night he had a lot of swelling in his neck.  Pt denies sob, pt reports green phlegm, pt talking in full sentences.

## 2022-09-08 NOTE — ED Provider Notes (Signed)
[place] EMERGENCY DEPARTMENT AT [facility] Provider Note   CSN: [id number] Arrival date & time: 09/08/22  1553     History  Chief Complaint  Patient presents with   Oral Swelling    Derek Malone is a 42 y.o. male.  HPI   42 year old male with medical history significant for wisdom tooth extraction surgery on Monday who presents to the emergency department with neck pain and swelling.  The patient states that he had the procedure done this past Monday and woke up that evening with significant swelling in his neck that he feels is worsening.  He denies any fevers or chills.  He endorses worsening pain in the neck with pain with range of motion.  He denies any chest pain or shortness of breath.  He has been coughing up greenish phlegm.  He is worried about infection.  He endorses swelling from his jaw down to his neck.  Home Medications Prior to Admission medications   Medication Sig Start Date End Date Taking? Authorizing Provider  amoxicillin-clavulanate (AUGMENTIN) 875-125 MG tablet Take 1 tablet by mouth every 12 (twelve) hours. 09/08/22  Yes Ernie Avena, MD  dicyclomine (BENTYL) 20 MG tablet Take 1 tablet (20 mg total) by mouth 2 (two) times daily. 06/01/21   Edison Simon, MD  ondansetron (ZOFRAN) 4 MG tablet Take 1 tablet (4 mg total) by mouth every 6 (six) hours. 06/01/21   Edison Simon, MD      Allergies    Patient has no known allergies.    Review of Systems   Review of Systems  All other systems reviewed and are negative.   Physical Exam Updated Vital Signs BP 124/82   Pulse 90   Temp (!) 97.4 F (36.3 C) (Oral)   Resp 16   Ht 5\' 9"  (1.753 m)   Wt 70.8 kg   SpO2 98%   BMI 23.04 kg/m  Physical Exam Vitals and nursing note reviewed.  Constitutional:      General: He is not in acute distress.    Appearance: He is well-developed.  HENT:     Head: Normocephalic and atraumatic.  Eyes:     Conjunctiva/sclera: Conjunctivae normal.   Neck:     Comments: Left sided crepitus noted with tenderness to palpation, no clear erythema, swelling present Cardiovascular:     Rate and Rhythm: Normal rate and regular rhythm.  Pulmonary:     Effort: Pulmonary effort is normal. No respiratory distress.     Breath sounds: Normal breath sounds.  Abdominal:     Palpations: Abdomen is soft.     Tenderness: There is no abdominal tenderness.  Musculoskeletal:        General: No swelling.     Cervical back: Neck supple.  Skin:    General: Skin is warm and dry.     Capillary Refill: Capillary refill takes less than 2 seconds.  Neurological:     Mental Status: He is alert.  Psychiatric:        Mood and Affect: Mood normal.     ED Results / Procedures / Treatments   Labs (all labs ordered are listed, but only abnormal results are displayed) Labs Reviewed  COMPREHENSIVE METABOLIC PANEL - Abnormal; Notable for the following components:      Result Value   Potassium 3.3 (*)    Total Protein 8.6 (*)    All other components within normal limits  CULTURE, BLOOD (ROUTINE X 2)  CULTURE, BLOOD (ROUTINE X 2)  CBC WITH DIFFERENTIAL/PLATELET  LACTIC ACID, PLASMA    EKG None  Radiology CT Chest Wo Contrast  Result Date: 09/08/2022 CLINICAL DATA:  Neck pain and swelling after with some teeth extraction 2 days ago. CT of the neck earlier this evening demonstrated extensive subcutaneous gas throughout the soft tissues of the neck extending into the upper mediastinum and chest. EXAM: CT CHEST WITHOUT CONTRAST TECHNIQUE: Multidetector CT imaging of the chest was performed following the standard protocol without IV contrast. RADIATION DOSE REDUCTION: This exam was performed according to the departmental dose-optimization program which includes automated exposure control, adjustment of the mA and/or kV according to patient size and/or use of iterative reconstruction technique. COMPARISON:  CT of the neck earlier today and prior CT of the chest  with contrast on 11/01/2010 FINDINGS: Cardiovascular: Normal caliber thoracic aorta without atherosclerosis. The heart size is normal. No visualized calcified coronary artery plaque or pericardial fluid. Central pulmonary arteries are normal in caliber. No pneumopericardium. Mediastinum/Nodes: Pneumomediastinum present predominantly in the superior mediastinum extending from the soft tissues of the neck. No associated fluid in the mediastinum. The trachea and esophagus appear normally patent and positioned. No lymphadenopathy identified. Lungs/Pleura: Suggestion of potential mild centrilobular emphysematous lung disease bilaterally. There is no evidence of pulmonary edema, consolidation, pneumothorax, nodule, abscess or pleural fluid. Upper Abdomen: No acute abnormality. Musculoskeletal: Subcutaneous emphysema extending from the neck along soft tissue planes into the subpectoral and axillary regions bilaterally. Small amount of subcutaneous air also present in the posterior paraspinous soft tissues and in both upper back regions. No fractures or bony lesions. No abnormal fluid collections in the soft tissues. IMPRESSION: 1. Pneumomediastinum and subcutaneous emphysema extending from the neck. Pneumomediastinum is predominantly in the superior mediastinum. Subcutaneous emphysema extends from the neck into the subpectoral and axillary regions bilaterally as well as in the posterior paraspinous soft tissues and upper back regions. No associated fluid collections. The trachea and esophagus appear normally patent and positioned. 2. Suggestion of potential mild centrilobular emphysematous lung disease bilaterally. Emphysema (ICD10-J43.9). Electronically Signed   By: Irish Lack M.D.   On: 09/08/2022 19:41   CT Soft Tissue Neck W Contrast  Result Date: 09/08/2022 CLINICAL DATA:  Soft tissue infection suspected, neck, xray done Crepitus left neck, recent wisdom tooth removal EXAM: CT NECK WITH CONTRAST TECHNIQUE:  Multidetector CT imaging of the neck was performed using the standard protocol following the bolus administration of intravenous contrast. RADIATION DOSE REDUCTION: This exam was performed according to the departmental dose-optimization program which includes automated exposure control, adjustment of the mA and/or kV according to patient size and/or use of iterative reconstruction technique. CONTRAST:  75mL OMNIPAQUE IOHEXOL 350 MG/ML SOLN COMPARISON:  CT CAP 11/01/10 FINDINGS: Pharynx and larynx: There is extensive subcutaneous emphysema involving the superficial and deep soft tissues of the neck extending superiorly to the level of the right masticator space and inferiorly to the visualized mediastinum. Subcutaneous air is also seen in the bilateral axillary soft tissues. There is no evidence of an odontogenic soft tissue abscess. No large dental carie or periapical lucency is visualized. 2 postsurgical changes from a recent tooth extraction along the left mandible Salivary glands: No inflammation, mass, or stone. Thyroid: Normal. Lymph nodes: None enlarged or abnormal density. Vascular: Negative. Limited intracranial: Negative. Visualized orbits: Negative. Mastoids and visualized paranasal sinuses: Middle ear or mastoid effusion. Complete opacification of the right maxillary sinus and mucosal thickening in the right ethmoid sinus. Orbits are unremarkable. Skeleton: No acute or aggressive process. Upper  chest: No pneumothorax Other: None IMPRESSION: Extensive subcutaneous emphysema involving the superficial and deep soft tissues of the neck extending superiorly to the level of the right masticator space and inferiorly to the visualized mediastinum. Subcutaneous air is also seen in the bilateral axillary soft tissues. No evidence of an odontogenic soft tissue abscess. No large dental or periapical lucency is visualized. Findings may be secondary to [facility] effect if there is a recent history of increased  intrathoracic pressure. Recommend further evaluation with a chest CT. Electronically Signed   By: Lorenza Cambridge M.D.   On: 09/08/2022 18:55   DG Chest 2 View  Result Date: 09/08/2022 CLINICAL DATA:  Crepitus EXAM: CHEST - 2 VIEW COMPARISON:  01/15/2009 FINDINGS: Fairly extensive soft tissue emphysema within the upper chest walls and neck. Probable upper pneumomediastinum. No acute airspace disease or pleural effusion. No discrete pneumothorax is seen. IMPRESSION: Fairly extensive soft tissue emphysema within the upper chest walls and neck. Probable pneumomediastinum. No discrete definitive pneumothorax is seen. Electronically Signed   By: Jasmine Pang M.D.   On: 09/08/2022 17:23    Procedures Procedures    Medications Ordered in ED Medications  acetaminophen (TYLENOL) tablet 1,000 mg (has no administration in time range)  vancomycin (VANCOCIN) IVPB 1000 mg/200 mL premix (0 mg Intravenous Stopped 09/08/22 2133)  piperacillin-tazobactam (ZOSYN) IVPB 3.375 g (0 g Intravenous Stopped 09/08/22 1924)  clindamycin (CLEOCIN) IVPB 600 mg (0 mg Intravenous Stopped 09/08/22 1924)  lactated ringers bolus 1,000 mL (0 mLs Intravenous Stopped 09/08/22 2133)  iohexol (OMNIPAQUE) 350 MG/ML injection 75 mL (75 mLs Intravenous Contrast Given 09/08/22 1821)    ED Course/ Medical Decision Making/ A&P                             Medical Decision Making Amount and/or Complexity of Data Reviewed Labs: ordered. Radiology: ordered.  Risk OTC drugs. Prescription drug management. Decision regarding hospitalization.    42 year old male with medical history significant for wisdom tooth extraction surgery on Monday who presents to the emergency department with neck pain and swelling.  The patient states that he had the procedure done this past Monday and woke up that evening with significant swelling in his neck that he feels is worsening.  He denies any fevers or chills.  He endorses worsening pain in the neck with  pain with range of motion.  He denies any chest pain or shortness of breath.  He has been coughing up greenish phlegm.  He is worried about infection.  He endorses swelling from his jaw down to his neck.  On arrival, the patient was afebrile, vitally stable, sinus rhythm noted on cardiac telemetry.  Physical exam concerning for left neck swelling with crepitus palpated.  Differential diagnosis includes necrotizing fasciitis status post surgery with neck infection, pneumothorax or pneumomediastinum s/p sedation.   IV access was obtained and a chest x-ray, CT of the neck and screening labs were obtained. Blood cultures were drawn and the patient was started on empiric antibiotics and given a fluid bolus for volume resuscitation.   Spoke with Dr. Dorris Fetch of CT surgery who recommended ENT consult.  ENT: Spoke with Dr. Ernestene Kiel of ENT, looked at the patient's CT of the chest and neck, extensive subcutaneous emphysema. Recommended admission for observation.  Laboratory evaluation significant for CBC without a leukocytosis or anemia, blood cultures collected and pending, lactic acid 1.2, CMP generally unremarkable, mild hypokalemia to 3.3, replenished orally.  After discussing  with both ENT and CT surgery, low concern for necrotizing fasciitis at this time given the reassuring labs and vitals.  Patient does not have significant pain out of proportion to exam.  Symptoms could be due to trauma from mechanical ventilation during his procedure.   Given the possibility of necrotizing fasciitis although less likely, recommendations included admission for observation.  The patient was covered with broad-spectrum antibiotics to include IV vancomycin, Zosyn and clindamycin.  Hospitalist medicine was consulted for admission.  After admission, the patient decided he would prefer to not be admitted. Lower suspicion for necrotizing fasciitis at this point.  Discharge the patient on a course of Augmentin.  They had  explained to the patient the need for observation in the setting of his extensive pneumomediastinum and subcutaneous air.  The patient preferred to be discharged.  Augmentin sent to the patient's pharmacy.  Patient was aware of the risks associated with leaving AGAINST MEDICAL ADVICE but still preferred to leave and be discharged.   Final Clinical Impression(s) / ED Diagnoses Final diagnoses:  Pneumomediastinum (HCC)  Subcutaneous emphysema, initial encounter [facility])    Rx / DC Orders ED Discharge Orders          Ordered    amoxicillin-clavulanate (AUGMENTIN) 875-125 MG tablet  Every 12 hours        09/08/22 2133              Ernie Avena, MD 09/08/22 2257

## 2022-09-08 NOTE — ED Provider Notes (Signed)
Seen briefly in triage.  Had oral surgery on the afternoon of May 19.  That evening he started noticing some swelling in his neck bilaterally.  He had already had some nasal drainage, but now he seems to have more postnasal drainage and is spitting up green mucus.  No fever.  Some feeling of having a hard time swallowing and his throat is hurting.  He states that his neck is bigger than usual and he cannot feel his bones such as his clavicles like usual.  He has been taking ibuprofen, another pain medicine, and using an oral rinse.  Vitals are reassuring, and his exam does not show any stridor or wheezing.  His neck at the base is possibly mildly swollen.  Oropharynx is benign as far as I can tell.  We discussed that he could be seen here or he could proceed to the emergency room if he thought he might need advanced imaging.  He decided to proceed to the emergency room he will go there by private car   Zenia Resides, MD 09/08/22 1550

## 2022-09-08 NOTE — Consult Note (Signed)
ENT BRIEF CONSULT NOTE  ED Consulted for subcutaneous emphysema of the face, neck and upper chest after oral surgeon wisdom tooth extractions several days. Thoracic surgery consulted - no specific recommendations given.   Patient c/o neck swelling, no significant pain, odynophagia, dyspnea. ED vital signs normal, afebrile, normal HR and blood pressure. WBC normal. Neck exam non-tender per ED Attending.  I reviewed the CT. There is impressive subcutaneous emphysema. This is atypical in the absence of trauma history and sometimes is evident with necrotizing fasciitis, however the rest of the clinical history, normal WBC and vital signs do not fit NF. Perhaps some impressive insufflation from bag masking during oral surgery involving dental extraction sockets leading to emphysema. Trachea appears normal on CT without signs of rupture. No hemoptysis noted in history.  No ENT Surgical intervention indicated. If increasing signs/symptoms of infection, recommend repeat evaluation and imaging.  ED attending is going to observe patient overnight and treat with abx which is reasonable.  Electronically signed by:  Scarlette Ar, MD  Staff Physician Facial Plastic & Reconstructive Surgery Otolaryngology - Head and Neck Surgery [facility] [place] [facility] [place]

## 2022-09-08 NOTE — ED Notes (Signed)
Patient transported to CT 

## 2022-09-13 LAB — CULTURE, BLOOD (ROUTINE X 2)
Culture: NO GROWTH
Culture: NO GROWTH
Special Requests: ADEQUATE

## 2023-07-20 ENCOUNTER — Ambulatory Visit: Payer: BLUE CROSS/BLUE SHIELD | Admitting: Physician Assistant
